# Patient Record
Sex: Female | Born: 1980 | Race: White | Hispanic: No | Marital: Married | Smoking: Never smoker
Health system: Southern US, Community
[De-identification: ages and names within clinical notes are randomized; demographics above are authoritative.]

## PROBLEM LIST (undated history)

## (undated) DIAGNOSIS — O169 Unspecified maternal hypertension, unspecified trimester: Secondary | ICD-10-CM

## (undated) DIAGNOSIS — O10919 Unspecified pre-existing hypertension complicating pregnancy, unspecified trimester: Secondary | ICD-10-CM

## (undated) DIAGNOSIS — O358XX0 Maternal care for other (suspected) fetal abnormality and damage, not applicable or unspecified: Secondary | ICD-10-CM

## (undated) DIAGNOSIS — O24419 Gestational diabetes mellitus in pregnancy, unspecified control: Secondary | ICD-10-CM

## (undated) DIAGNOSIS — O35BXX0 Maternal care for other (suspected) fetal abnormality and damage, fetal cardiac anomalies, not applicable or unspecified: Secondary | ICD-10-CM

## (undated) HISTORY — PX: MOUTH SURGERY: SHX715

---

## 2011-05-21 LAB — OB RESULTS CONSOLE ANTIBODY SCREEN: Antibody Screen: NEGATIVE

## 2011-05-21 LAB — OB RESULTS CONSOLE ABO/RH

## 2011-12-12 ENCOUNTER — Ambulatory Visit (HOSPITAL_COMMUNITY)
Admission: RE | Admit: 2011-12-12 | Discharge: 2011-12-12 | Disposition: A | Discharge status: Home / Self Care | Payer: BC Managed Care – PPO | Source: Ambulatory Visit | Attending: Certified Nurse Midwife | Admitting: Certified Nurse Midwife

## 2011-12-12 ENCOUNTER — Encounter (HOSPITAL_COMMUNITY): Payer: Self-pay | Admitting: Certified Nurse Midwife

## 2011-12-12 ENCOUNTER — Other Ambulatory Visit (HOSPITAL_COMMUNITY): Payer: Self-pay | Admitting: Certified Nurse Midwife

## 2011-12-12 ENCOUNTER — Inpatient Hospital Stay (HOSPITAL_COMMUNITY)
Admission: AD | Admit: 2011-12-12 | Discharge: 2011-12-16 | DRG: 370 | Disposition: A | Discharge status: Home / Self Care | Payer: BC Managed Care – PPO | Source: Ambulatory Visit | Attending: Obstetrics & Gynecology | Admitting: Obstetrics & Gynecology

## 2011-12-12 ENCOUNTER — Encounter (HOSPITAL_COMMUNITY): Payer: Self-pay

## 2011-12-12 DIAGNOSIS — O359XX0 Maternal care for (suspected) fetal abnormality and damage, unspecified, not applicable or unspecified: Secondary | ICD-10-CM | POA: Diagnosis present

## 2011-12-12 DIAGNOSIS — O2690 Pregnancy related conditions, unspecified, unspecified trimester: Secondary | ICD-10-CM

## 2011-12-12 DIAGNOSIS — O1002 Pre-existing essential hypertension complicating childbirth: Secondary | ICD-10-CM | POA: Diagnosis present

## 2011-12-12 DIAGNOSIS — O10019 Pre-existing essential hypertension complicating pregnancy, unspecified trimester: Secondary | ICD-10-CM | POA: Insufficient documentation

## 2011-12-12 DIAGNOSIS — O358XX0 Maternal care for other (suspected) fetal abnormality and damage, not applicable or unspecified: Secondary | ICD-10-CM | POA: Insufficient documentation

## 2011-12-12 DIAGNOSIS — O9903 Anemia complicating the puerperium: Secondary | ICD-10-CM | POA: Diagnosis not present

## 2011-12-12 DIAGNOSIS — D62 Acute posthemorrhagic anemia: Secondary | ICD-10-CM | POA: Diagnosis not present

## 2011-12-12 DIAGNOSIS — O169 Unspecified maternal hypertension, unspecified trimester: Secondary | ICD-10-CM

## 2011-12-12 DIAGNOSIS — O24419 Gestational diabetes mellitus in pregnancy, unspecified control: Secondary | ICD-10-CM

## 2011-12-12 DIAGNOSIS — Z01812 Encounter for preprocedural laboratory examination: Secondary | ICD-10-CM

## 2011-12-12 DIAGNOSIS — O99814 Abnormal glucose complicating childbirth: Secondary | ICD-10-CM | POA: Diagnosis present

## 2011-12-12 DIAGNOSIS — O99810 Abnormal glucose complicating pregnancy: Secondary | ICD-10-CM | POA: Insufficient documentation

## 2011-12-12 HISTORY — DX: Unspecified pre-existing hypertension complicating pregnancy, unspecified trimester: O10.919

## 2011-12-12 HISTORY — DX: Maternal care for other (suspected) fetal abnormality and damage, not applicable or unspecified: O35.8XX0

## 2011-12-12 HISTORY — DX: Gestational diabetes mellitus in pregnancy, unspecified control: O24.419

## 2011-12-12 HISTORY — DX: Unspecified maternal hypertension, unspecified trimester: O16.9

## 2011-12-12 HISTORY — DX: Maternal care for other (suspected) fetal abnormality and damage, fetal cardiac anomalies, not applicable or unspecified: O35.BXX0

## 2011-12-12 LAB — COMPREHENSIVE METABOLIC PANEL
ALT: 11 U/L (ref 0–35)
AST: 18 U/L (ref 0–37)
Albumin: 2.6 g/dL — ABNORMAL LOW (ref 3.5–5.2)
Alkaline Phosphatase: 188 U/L — ABNORMAL HIGH (ref 39–117)
BUN: 12 mg/dL (ref 6–23)
CO2: 20 mEq/L (ref 19–32)
Calcium: 9.7 mg/dL (ref 8.4–10.5)
Chloride: 99 mEq/L (ref 96–112)
Creatinine, Ser: 0.61 mg/dL (ref 0.50–1.10)
GFR calc Af Amer: >90 mL/min (ref >90)
GFR calc non Af Amer: >90 mL/min (ref >90)
Glucose, Bld: 114 mg/dL — ABNORMAL HIGH (ref 70–99)
Potassium: 4 mEq/L (ref 3.5–5.1)
Sodium: 133 mEq/L — ABNORMAL LOW (ref 135–145)
Total Bilirubin: 0.2 mg/dL — ABNORMAL LOW (ref 0.3–1.2)
Total Protein: 6.3 g/dL (ref 6.0–8.3)

## 2011-12-12 LAB — CBC
HCT: 31 % — ABNORMAL LOW (ref 36.0–46.0)
Hemoglobin: 10.1 g/dL — ABNORMAL LOW (ref 12.0–15.0)
MCH: 27.5 pg (ref 26.0–34.0)
MCHC: 32.6 g/dL (ref 30.0–36.0)
MCV: 84.5 fL (ref 78.0–100.0)
Platelets: 272 10*3/uL (ref 150–400)
RBC: 3.67 MIL/uL — ABNORMAL LOW (ref 3.87–5.11)
RDW: 14 % (ref 11.5–15.5)
WBC: 9.1 10*3/uL (ref 4.0–10.5)

## 2011-12-12 MED ORDER — FAMOTIDINE 20 MG PO TABS
40.0000 mg | ORAL_TABLET | Freq: Every day | ORAL | Status: DC
  Filled 2011-12-12: qty 1

## 2011-12-12 MED ORDER — SODIUM CHLORIDE 0.9 % IV SOLN
8.0000 mg | Freq: Once | INTRAVENOUS | Status: AC
  Administered 2011-12-12: 8 mg via INTRAVENOUS
  Filled 2011-12-12: qty 4

## 2011-12-12 MED ORDER — PRENATAL MULTIVITAMIN CH: 1.0000 | ORAL_TABLET | Freq: Every day | ORAL | Status: DC

## 2011-12-12 MED ORDER — LACTATED RINGERS IV SOLN: INTRAVENOUS | Status: DC

## 2011-12-12 MED ORDER — LABETALOL HCL 300 MG PO TABS
300.0000 mg | ORAL_TABLET | Freq: Three times a day (TID) | ORAL | Status: DC
  Administered 2011-12-13: 300 mg via ORAL
  Filled 2011-12-12 (×2): qty 1

## 2011-12-12 MED ORDER — ZOLPIDEM TARTRATE 5 MG PO TABS
5.0000 mg | ORAL_TABLET | Freq: Every evening | ORAL | Status: DC | PRN
  Administered 2011-12-13: 5 mg via ORAL
  Filled 2011-12-12: qty 1

## 2011-12-12 MED ORDER — ACETAMINOPHEN 325 MG PO TABS: 650.0000 mg | ORAL_TABLET | ORAL | Status: DC | PRN

## 2011-12-12 NOTE — H&P (Addendum)
MD note Met with pt and reviewed findings. Also d/w Dr Sherrie George who repeated sonogram today. Agree with above note and plan and recommend cesarean delivery since unfavorable cervix and remote from delivery. 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 Suspected fetal cardiac anomaly on fetal echo at 36 wks after audible arrythmia in office doing ANtesting for [place]. Since then BPP done twice and yesterday sono noted fetal skin edema, s/p MFM consult, see above.  Physical exam:  A&O x 3, no acute distress. Pleasant HEENT neg, no thyromegaly Lungs CTA bilat CV RRR, S1S2 normal Abdo soft, non tender, non acute Extr no edema/ tenderness Pelvic deferred FHT 140s/ + accels/ no decels/ moderate variab- reactive.  Toco none  Risks/complications of surgery reviewed incl infection, bleeding, damage to internal organs including bladder, bowels, ureters, blood vessels, other risks from anesthesia, VTE and delayed complications of any surgery, complications in future surgery reviewed. Also discussed neonatal complications incl difficult delivery, laceration, vacuum assistance, TTN etc. Pt understands and agrees, all concerns addressed.

## 2011-12-12 NOTE — H&P (Signed)
OB ADMISSION/ HISTORY & PHYSICAL:  Admission Date: 12/12/2011  8:19 PM  Admit Diagnosis: Pregnancy at 37 weeks affected by fetal cardiac anomaly / Chronic hypertension / Glucose intolerance - GDMA1 / obesity  Kayla Gibbs is a 31 y.o. female presenting for admission / continuous EFM until scheduled CS in am. Newly detected fetal cardiac anomaly at 36 week due to audible FHR arrythmia during NST. Per fetal echo notes from Dr Mayer Camel [facility]): possible coarctation of aorta VS narrowing of ductus VS premature closure of ductus. No medication to cause premature closure of ductus. Recommend for twice weekly fetal testing and to repeat fetal echo in 2 weeks - scheduled for 38 weeks with Dr Mayer Camel.   Questionable fetal testing Tuesday this week  (6/10) -with questionable decreased tone with active FM and reactive NST. Repeat AT today (8/10)  with non-reactive NST but BPP 8-8 and normal UA doppler. Ultrasonographer noted change in fetal appearance with fetal edema over abdomen and head. Consult with Dr Juliene Pina - patient sent for second opinion with MFM this afternoon - confirmed fetal edema.   Discussion with patient and spouse per myself and Dr Juliene Pina regarding ultrasound finding with hx of possible fetal cardiac anomaly now showing signs of changes in cardiac function with fetal edema.Per recommendation of Dr Mayer Camel - deliver with any signs of change in fetal status suggestive of change in cardiac function.  Recommend delivery with decision for CS due to fetal status and unfavorable cervix in primigravida. Will schedule Friday in day hours to ensure adequate neonatal / pediatric support for newborn - recommend continuous fetal surveillance until delivery. Patient and spouse understands plan of care. Neonatal team updated with impending delivery by Dr Juliene Pina.  Prenatal History: G1P0000   EDC : 12/28/2011, by Last Menstrual Period  Prenatal care at [facility] since [redacted] weeks  gestation  Prenatal course complicated by obesity / chronic hypertension / HSV outbreak in pregnancy / GERD /  glucose intolerance GDMA1 / fetal arrythmia / possible fetal cardiac anomaly.  Prenatal Labs: ABO, Rh:   O positive Antibody:  negative Rubella:   Immune RPR:   NR HBsAg:   Negative HIV:   NR GBS:   negative 1 hr Glucola : early screen abnormal / diet and CBG monitoring management                       HgbA1c with early screen - 5.1 / third trimester repeat 5.6   Medical / Surgical History :  Past medical history:  Past Medical History  Diagnosis Date  . Gestational diabetes     GERD   Chronic hypertension  Past surgical history: No past surgical history on file. Oral surgery - wisdom teeth extraction  Family History: No family history on file.   Social History:  does not have a smoking history on file. She does not have any smokeless tobacco history on file. Her alcohol and drug histories not on file. Married No substance use  Allergies: Review of patient's allergies indicates no known allergies.  NKDA No latex allergy  Current Medications at time of admission:  PNV daily Labetalol 300 mg TID Zantac 300 mg at HS and 150 mg in am Valtrex 500mg  with HSV outbreak  Review of Systems: Active FM Denies ctx / LOF / bleeding  Physical Exam: Alert and oriented x 3 Heart - Lungs -  Abdomen - gravid / soft / non-tender Extremities - trace dependent edema  Assessment: 37 5/[redacted] weeks pregnant Fetal edema with fetal cardiac anamoly Chronic hypertension - stable on labetalol 300 TID Glucose intolerance - GDMA1 GERD   Plan:  Admit Continuous EFM until scheduled CS Planned primary cesarean section in AM NPO after midnight Pre-op order in am  Marlinda Mike CNM, MSN 12/12/2011, 8:29 PM

## 2011-12-13 ENCOUNTER — Encounter (HOSPITAL_COMMUNITY): Payer: Self-pay

## 2011-12-13 ENCOUNTER — Encounter (HOSPITAL_COMMUNITY)
Admission: AD | Disposition: A | Discharge status: Home / Self Care | Payer: Self-pay | Source: Ambulatory Visit | Attending: Obstetrics & Gynecology

## 2011-12-13 ENCOUNTER — Encounter (HOSPITAL_COMMUNITY): Payer: Self-pay | Admitting: Anesthesiology

## 2011-12-13 ENCOUNTER — Encounter (HOSPITAL_COMMUNITY): Payer: Self-pay | Admitting: General Surgery

## 2011-12-13 ENCOUNTER — Inpatient Hospital Stay (HOSPITAL_COMMUNITY): Payer: BC Managed Care – PPO

## 2011-12-13 DIAGNOSIS — O169 Unspecified maternal hypertension, unspecified trimester: Secondary | ICD-10-CM

## 2011-12-13 DIAGNOSIS — O24419 Gestational diabetes mellitus in pregnancy, unspecified control: Secondary | ICD-10-CM | POA: Diagnosis present

## 2011-12-13 HISTORY — DX: Unspecified maternal hypertension, unspecified trimester: O16.9

## 2011-12-13 HISTORY — DX: Gestational diabetes mellitus in pregnancy, unspecified control: O24.419

## 2011-12-13 LAB — RPR: RPR Ser Ql: NONREACTIVE

## 2011-12-13 LAB — GLUCOSE, CAPILLARY: Glucose-Capillary: 113 mg/dL — ABNORMAL HIGH (ref 70–99)

## 2011-12-13 SURGERY — Surgical Case
Anesthesia: Spinal | Site: Abdomen | Wound class: Clean Contaminated

## 2011-12-13 MED ORDER — PHENYLEPHRINE HCL 10 MG/ML IJ SOLN
INTRAMUSCULAR | Status: DC | PRN
  Administered 2011-12-13: 80 ug via INTRAVENOUS
  Administered 2011-12-13 (×3): 40 ug via INTRAVENOUS

## 2011-12-13 MED ORDER — ONDANSETRON HCL 4 MG/2ML IJ SOLN: 4.0000 mg | Freq: Three times a day (TID) | INTRAMUSCULAR | Status: DC | PRN

## 2011-12-13 MED ORDER — LANOLIN HYDROUS EX OINT: 1.0000 "application " | TOPICAL_OINTMENT | CUTANEOUS | Status: DC | PRN

## 2011-12-13 MED ORDER — CITRIC ACID-SODIUM CITRATE 334-500 MG/5ML PO SOLN
30.0000 mL | Freq: Once | ORAL | Status: AC
  Administered 2011-12-13: 30 mL via ORAL
  Filled 2011-12-13 (×2): qty 15

## 2011-12-13 MED ORDER — FAMOTIDINE 20 MG PO TABS
20.0000 mg | ORAL_TABLET | Freq: Every day | ORAL | Status: DC
  Administered 2011-12-14 – 2011-12-16 (×3): 20 mg via ORAL
  Filled 2011-12-13 (×3): qty 1

## 2011-12-13 MED ORDER — PHENYLEPHRINE 40 MCG/ML (10ML) SYRINGE FOR IV PUSH (FOR BLOOD PRESSURE SUPPORT)
PREFILLED_SYRINGE | INTRAVENOUS | Status: AC
  Filled 2011-12-13: qty 5

## 2011-12-13 MED ORDER — IBUPROFEN 600 MG PO TABS
600.0000 mg | ORAL_TABLET | Freq: Four times a day (QID) | ORAL | Status: DC
  Filled 2011-12-13: qty 1

## 2011-12-13 MED ORDER — FENTANYL CITRATE 0.05 MG/ML IJ SOLN
INTRAMUSCULAR | Status: AC
  Filled 2011-12-13: qty 2

## 2011-12-13 MED ORDER — PRENATAL MULTIVITAMIN CH
1.0000 | ORAL_TABLET | Freq: Every day | ORAL | Status: DC
  Administered 2011-12-13: 1 via ORAL
  Filled 2011-12-13 (×2): qty 1

## 2011-12-13 MED ORDER — NALBUPHINE HCL 10 MG/ML IJ SOLN
5.0000 mg | INTRAMUSCULAR | Status: DC | PRN
  Filled 2011-12-13: qty 1

## 2011-12-13 MED ORDER — KETOROLAC TROMETHAMINE 30 MG/ML IJ SOLN
INTRAMUSCULAR | Status: AC
  Administered 2011-12-13: 30 mg via INTRAVENOUS
  Filled 2011-12-13: qty 1

## 2011-12-13 MED ORDER — SODIUM CHLORIDE 0.9 % IJ SOLN: 3.0000 mL | INTRAMUSCULAR | Status: DC | PRN

## 2011-12-13 MED ORDER — METOCLOPRAMIDE HCL 5 MG/ML IJ SOLN: 10.0000 mg | Freq: Three times a day (TID) | INTRAMUSCULAR | Status: DC | PRN

## 2011-12-13 MED ORDER — SODIUM CHLORIDE 0.9 % IV SOLN
1.0000 ug/kg/h | INTRAVENOUS | Status: DC | PRN
  Filled 2011-12-13: qty 2.5

## 2011-12-13 MED ORDER — FENTANYL CITRATE 0.05 MG/ML IJ SOLN
INTRAMUSCULAR | Status: DC | PRN
  Administered 2011-12-13: 25 ug via INTRATHECAL

## 2011-12-13 MED ORDER — SCOPOLAMINE 1 MG/3DAYS TD PT72
1.0000 | MEDICATED_PATCH | TRANSDERMAL | Status: DC
  Administered 2011-12-13: 1.5 mg via TRANSDERMAL
  Filled 2011-12-13: qty 1

## 2011-12-13 MED ORDER — MORPHINE SULFATE (PF) 0.5 MG/ML IJ SOLN
INTRAMUSCULAR | Status: DC | PRN
  Administered 2011-12-13: .5 mg via INTRATHECAL

## 2011-12-13 MED ORDER — DIBUCAINE 1 % RE OINT: 1.0000 "application " | TOPICAL_OINTMENT | RECTAL | Status: DC | PRN

## 2011-12-13 MED ORDER — CEFAZOLIN SODIUM 10 G IJ SOLR
3.0000 g | INTRAMUSCULAR | Status: DC
  Filled 2011-12-13: qty 3000

## 2011-12-13 MED ORDER — DIPHENHYDRAMINE HCL 25 MG PO CAPS: 25.0000 mg | ORAL_CAPSULE | Freq: Four times a day (QID) | ORAL | Status: DC | PRN

## 2011-12-13 MED ORDER — SENNOSIDES-DOCUSATE SODIUM 8.6-50 MG PO TABS
2.0000 | ORAL_TABLET | Freq: Every day | ORAL | Status: DC
  Administered 2011-12-13 – 2011-12-15 (×3): 2 via ORAL

## 2011-12-13 MED ORDER — DEXTROSE 5 % IV SOLN
3.0000 g | INTRAVENOUS | Status: DC | PRN
  Administered 2011-12-13: 3 g via INTRAVENOUS

## 2011-12-13 MED ORDER — KETOROLAC TROMETHAMINE 30 MG/ML IJ SOLN
30.0000 mg | Freq: Four times a day (QID) | INTRAMUSCULAR | Status: DC | PRN
  Administered 2011-12-13: 30 mg via INTRAVENOUS

## 2011-12-13 MED ORDER — LACTATED RINGERS IV SOLN
INTRAVENOUS | Status: DC | PRN
  Administered 2011-12-13 (×2): via INTRAVENOUS

## 2011-12-13 MED ORDER — TETANUS-DIPHTH-ACELL PERTUSSIS 5-2.5-18.5 LF-MCG/0.5 IM SUSP: 0.5000 mL | Freq: Once | INTRAMUSCULAR | Status: DC

## 2011-12-13 MED ORDER — KETOROLAC TROMETHAMINE 30 MG/ML IJ SOLN: 30.0000 mg | Freq: Four times a day (QID) | INTRAMUSCULAR | Status: DC | PRN

## 2011-12-13 MED ORDER — ONDANSETRON HCL 4 MG/2ML IJ SOLN
INTRAMUSCULAR | Status: AC
  Filled 2011-12-13: qty 2

## 2011-12-13 MED ORDER — SIMETHICONE 80 MG PO CHEW: 80.0000 mg | CHEWABLE_TABLET | ORAL | Status: DC | PRN

## 2011-12-13 MED ORDER — DIPHENHYDRAMINE HCL 50 MG/ML IJ SOLN: 12.5000 mg | INTRAMUSCULAR | Status: DC | PRN

## 2011-12-13 MED ORDER — DIPHENHYDRAMINE HCL 25 MG PO CAPS: 25.0000 mg | ORAL_CAPSULE | ORAL | Status: DC | PRN

## 2011-12-13 MED ORDER — WITCH HAZEL-GLYCERIN EX PADS: 1.0000 "application " | MEDICATED_PAD | CUTANEOUS | Status: DC | PRN

## 2011-12-13 MED ORDER — IBUPROFEN 100 MG/5ML PO SUSP
600.0000 mg | Freq: Four times a day (QID) | ORAL | Status: DC
  Administered 2011-12-13 – 2011-12-15 (×7): 600 mg via ORAL
  Filled 2011-12-13 (×8): qty 30

## 2011-12-13 MED ORDER — SIMETHICONE 80 MG PO CHEW
80.0000 mg | CHEWABLE_TABLET | Freq: Three times a day (TID) | ORAL | Status: DC
  Administered 2011-12-13 – 2011-12-16 (×12): 80 mg via ORAL

## 2011-12-13 MED ORDER — DIPHENHYDRAMINE HCL 50 MG/ML IJ SOLN: 25.0000 mg | INTRAMUSCULAR | Status: DC | PRN

## 2011-12-13 MED ORDER — ONDANSETRON HCL 4 MG/2ML IJ SOLN
INTRAMUSCULAR | Status: DC | PRN
  Administered 2011-12-13: 4 mg via INTRAVENOUS

## 2011-12-13 MED ORDER — SCOPOLAMINE 1 MG/3DAYS TD PT72
1.0000 | MEDICATED_PATCH | Freq: Once | TRANSDERMAL | Status: DC
  Filled 2011-12-13: qty 1

## 2011-12-13 MED ORDER — LACTATED RINGERS IV SOLN
INTRAVENOUS | Status: DC
  Administered 2011-12-13: 23:00:00 via INTRAVENOUS

## 2011-12-13 MED ORDER — OXYTOCIN 10 UNIT/ML IJ SOLN
40.0000 [IU] | INTRAVENOUS | Status: DC | PRN
  Administered 2011-12-13: 40 [IU] via INTRAVENOUS

## 2011-12-13 MED ORDER — LABETALOL HCL 300 MG PO TABS
300.0000 mg | ORAL_TABLET | Freq: Three times a day (TID) | ORAL | Status: DC
  Administered 2011-12-13 – 2011-12-16 (×9): 300 mg via ORAL
  Filled 2011-12-13 (×9): qty 1

## 2011-12-13 MED ORDER — MEPERIDINE HCL 25 MG/ML IJ SOLN
INTRAMUSCULAR | Status: AC
  Administered 2011-12-13: 6.25 mg via INTRAVENOUS
  Filled 2011-12-13: qty 1

## 2011-12-13 MED ORDER — IBUPROFEN 600 MG PO TABS: 600.0000 mg | ORAL_TABLET | Freq: Four times a day (QID) | ORAL | Status: DC | PRN

## 2011-12-13 MED ORDER — OXYCODONE-ACETAMINOPHEN 5-325 MG PO TABS: 1.0000 | ORAL_TABLET | ORAL | Status: DC | PRN

## 2011-12-13 MED ORDER — ONDANSETRON HCL 4 MG PO TABS: 4.0000 mg | ORAL_TABLET | ORAL | Status: DC | PRN

## 2011-12-13 MED ORDER — MEPERIDINE HCL 25 MG/ML IJ SOLN
6.2500 mg | INTRAMUSCULAR | Status: DC | PRN
  Administered 2011-12-13: 6.25 mg via INTRAVENOUS

## 2011-12-13 MED ORDER — BUPIVACAINE IN DEXTROSE 0.75-8.25 % IT SOLN
INTRATHECAL | Status: DC | PRN
  Administered 2011-12-13: 1.6 mL via INTRATHECAL

## 2011-12-13 MED ORDER — ZOLPIDEM TARTRATE 5 MG PO TABS: 5.0000 mg | ORAL_TABLET | Freq: Every evening | ORAL | Status: DC | PRN

## 2011-12-13 MED ORDER — MORPHINE SULFATE 0.5 MG/ML IJ SOLN
INTRAMUSCULAR | Status: AC
  Filled 2011-12-13: qty 10

## 2011-12-13 MED ORDER — ONDANSETRON HCL 4 MG/2ML IJ SOLN: 4.0000 mg | INTRAMUSCULAR | Status: DC | PRN

## 2011-12-13 MED ORDER — NALOXONE HCL 0.4 MG/ML IJ SOLN: 0.4000 mg | INTRAMUSCULAR | Status: DC | PRN

## 2011-12-13 MED ORDER — MENTHOL 3 MG MT LOZG: 1.0000 | LOZENGE | OROMUCOSAL | Status: DC | PRN

## 2011-12-13 MED ORDER — OXYTOCIN 40 UNITS IN LACTATED RINGERS INFUSION - SIMPLE MED: 62.5000 mL/h | INTRAVENOUS | Status: AC

## 2011-12-13 MED ORDER — FENTANYL CITRATE 0.05 MG/ML IJ SOLN: 25.0000 ug | INTRAMUSCULAR | Status: DC | PRN

## 2011-12-13 MED ORDER — OXYTOCIN 10 UNIT/ML IJ SOLN
INTRAMUSCULAR | Status: AC
  Filled 2011-12-13: qty 4

## 2011-12-13 SURGICAL SUPPLY — 46 items
BENZOIN TINCTURE PRP APPL 2/3 (GAUZE/BANDAGES/DRESSINGS) ×2 IMPLANT
CHLORAPREP W/TINT 26ML (MISCELLANEOUS) ×2 IMPLANT
CLOTH BEACON ORANGE TIMEOUT ST (SAFETY) ×2 IMPLANT
CONTAINER PREFILL 10% NBF 15ML (MISCELLANEOUS) IMPLANT
DRESSING TELFA 8X3 (GAUZE/BANDAGES/DRESSINGS) IMPLANT
DRSG COVADERM 4X10 (GAUZE/BANDAGES/DRESSINGS) ×2 IMPLANT
ELECT REM PT RETURN 9FT ADLT (ELECTROSURGICAL) ×2
ELECTRODE REM PT RTRN 9FT ADLT (ELECTROSURGICAL) ×1 IMPLANT
EXTRACTOR VACUUM KIWI (MISCELLANEOUS) IMPLANT
EXTRACTOR VACUUM M CUP 4 TUBE (SUCTIONS) ×2 IMPLANT
GAUZE SPONGE 4X4 12PLY STRL LF (GAUZE/BANDAGES/DRESSINGS) IMPLANT
GLOVE BIO SURGEON STRL SZ7 (GLOVE) ×2 IMPLANT
GLOVE BIOGEL PI IND STRL 7.0 (GLOVE) ×6 IMPLANT
GLOVE BIOGEL PI INDICATOR 7.0 (GLOVE) ×6
GLOVE ECLIPSE 6.5 STRL STRAW (GLOVE) ×6 IMPLANT
GLOVE SURG SS PI 7.0 STRL IVOR (GLOVE) ×4 IMPLANT
GOWN PREVENTION PLUS LG XLONG (DISPOSABLE) ×2 IMPLANT
GOWN STRL REIN XL XLG (GOWN DISPOSABLE) ×8 IMPLANT
KIT ABG SYR 3ML LUER SLIP (SYRINGE) ×2 IMPLANT
NEEDLE HYPO 25X5/8 SAFETYGLIDE (NEEDLE) ×2 IMPLANT
NS IRRIG 1000ML POUR BTL (IV SOLUTION) ×4 IMPLANT
PACK C SECTION WH (CUSTOM PROCEDURE TRAY) ×2 IMPLANT
PAD ABD 7.5X8 STRL (GAUZE/BANDAGES/DRESSINGS) IMPLANT
PAD OB MATERNITY 4.3X12.25 (PERSONAL CARE ITEMS) IMPLANT
RTRCTR C-SECT PINK 25CM LRG (MISCELLANEOUS) IMPLANT
SLEEVE SCD COMPRESS KNEE MED (MISCELLANEOUS) ×2 IMPLANT
SPONGE GAUZE 4X4 12PLY (GAUZE/BANDAGES/DRESSINGS) ×2 IMPLANT
STAPLER VISISTAT 35W (STAPLE) IMPLANT
STRIP CLOSURE SKIN 1/2X4 (GAUZE/BANDAGES/DRESSINGS) ×2 IMPLANT
STRIP CLOSURE SKIN 1/4X4 (GAUZE/BANDAGES/DRESSINGS) IMPLANT
SUT MNCRL 0 VIOLET CTX 36 (SUTURE) ×3 IMPLANT
SUT MONOCRYL 0 CTX 36 (SUTURE) ×3
SUT PLAIN 0 NONE (SUTURE) IMPLANT
SUT PLAIN 2 0 (SUTURE)
SUT PLAIN 2 0 XLH (SUTURE) ×2 IMPLANT
SUT PLAIN ABS 2-0 CT1 27XMFL (SUTURE) IMPLANT
SUT VIC AB 0 CT1 27 (SUTURE) ×3
SUT VIC AB 0 CT1 27XBRD ANBCTR (SUTURE) ×3 IMPLANT
SUT VIC AB 2-0 CT1 27 (SUTURE) ×2
SUT VIC AB 2-0 CT1 TAPERPNT 27 (SUTURE) ×2 IMPLANT
SUT VIC AB 4-0 KS 27 (SUTURE) ×2 IMPLANT
SUT VICRYL 0 TIES 12 18 (SUTURE) IMPLANT
TAPE CLOTH SURG 4X10 WHT LF (GAUZE/BANDAGES/DRESSINGS) ×2 IMPLANT
TOWEL OR 17X24 6PK STRL BLUE (TOWEL DISPOSABLE) ×4 IMPLANT
TRAY FOLEY CATH 14FR (SET/KITS/TRAYS/PACK) ×2 IMPLANT
WATER STERILE IRR 1000ML POUR (IV SOLUTION) ×2 IMPLANT

## 2011-12-13 NOTE — Anesthesia Postprocedure Evaluation (Signed)
  Anesthesia Post-op Note  Patient: Kayla Gibbs  Procedure(s) Performed: Procedure(s) (LRB) with comments: CESAREAN SECTION (N/A)  Patient Location: PACU  Anesthesia Type: Spinal  Level of Consciousness: awake, alert  and oriented  Airway and Oxygen Therapy: Patient Spontanous Breathing  Post-op Pain: none  Post-op Assessment: Post-op Vital signs reviewed, Patient's Cardiovascular Status Stable, Respiratory Function Stable, Patent Airway, No signs of Nausea or vomiting, Pain level controlled, No headache and No backache  Post-op Vital Signs: Reviewed and stable  Complications: No apparent anesthesia complications

## 2011-12-13 NOTE — Anesthesia Procedure Notes (Signed)
Spinal  Patient location during procedure: OR Start time: 12/13/2011 10:26 AM Staffing Anesthesiologist: Norabelle Kondo A. Performed by: anesthesiologist  Preanesthetic Checklist Completed: patient identified, site marked, surgical consent, pre-op evaluation, timeout performed, IV checked, risks and benefits discussed and monitors and equipment checked Spinal Block Patient position: sitting Prep: site prepped and draped and DuraPrep Patient monitoring: heart rate, cardiac monitor, continuous pulse ox and blood pressure Approach: midline Location: L3-4 Injection technique: single-shot Needle Needle type: Sprotte  Needle gauge: 24 G Needle length: 9 cm Needle insertion depth: 6 cm Assessment Sensory level: T4 Additional Notes Patient identified. Risks and benefits discussed including failed block, incomplete  Pain control, post dural puncture headache, nerve damage, paralysis, blood pressure Changes, nausea, vomiting, reactions to medications-both toxic and allergic and post Partum back pain. All questions were answered. Patient expressed understanding and wished to proceed. Sterile technique was used throughout procedure. Epidural site was Dressed with sterile barrier dressing. No paresthesias, signs of intravascular injection Or signs of intrathecal spread were encountered.  Patient was more comfortable after the epidural was dosed. Please see RN's note for documentation of vital signs and FHR which are stable.

## 2011-12-13 NOTE — Anesthesia Preprocedure Evaluation (Signed)
Anesthesia Evaluation    Airway Mallampati: III TM Distance: >3 FB Neck ROM: Full    Dental No notable dental hx. (+) Teeth Intact   Pulmonary neg pulmonary ROS,  breath sounds clear to auscultation  Pulmonary exam normal       Cardiovascular hypertension, Pt. on medications and Pt. on home beta blockers Rhythm:Regular Rate:Normal     Neuro/Psych negative neurological ROS  negative psych ROS   GI/Hepatic Neg liver ROS, GERD-  Medicated and Controlled,Nausea throughout pregnancy   Endo/Other  diabetes, Well ControlledMorbid obesity  Renal/GU negative Renal ROS  negative genitourinary   Musculoskeletal negative musculoskeletal ROS (+)   Abdominal (+) + obese,   Peds  Hematology negative hematology ROS (+)   Anesthesia Other Findings   Reproductive/Obstetrics (+) Pregnancy 38wks AOG suspected Fetal Cardiac Anomalies                           Anesthesia Physical Anesthesia Plan  ASA: III  Anesthesia Plan: Spinal   Post-op Pain Management:    Induction: Intravenous  Airway Management Planned: Natural Airway  Additional Equipment:   Intra-op Plan:   Post-operative Plan:   Informed Consent: I have reviewed the patients History and Physical, chart, labs and discussed the procedure including the risks, benefits and alternatives for the proposed anesthesia with the patient or authorized representative who has indicated his/her understanding and acceptance.   Dental advisory given  Plan Discussed with: Anesthesiologist, CRNA and Surgeon  Anesthesia Plan Comments:         Anesthesia Quick Evaluation

## 2011-12-13 NOTE — Anesthesia Postprocedure Evaluation (Signed)
Anesthesia Post Note  Patient: Kayla Gibbs  Procedure(s) Performed: Procedure(s) (LRB): CESAREAN SECTION (N/A)  Anesthesia type: Spinal  Patient location: Mother/Baby  Post pain: Pain level controlled  Post assessment: Post-op Vital signs reviewed  Last Vitals:  Filed Vitals:   12/13/11 1627  BP: 142/89  Pulse: 71  Temp: 36.8 C  Resp: 20    Post vital signs: Reviewed  Level of consciousness: awake  Complications: No apparent anesthesia complications

## 2011-12-13 NOTE — Op Note (Signed)
Primary Low Transverse Cesarean Section Procedure Note  12/13/2011  Kayla Gibbs  Indications: 31 yo, Chronic HTN and Gestational DM, noted audible arrhythemia during antenatal testing. Fetal echo by Peds Cardiologist at 37 wks suspected fetal cardiac anomaly. Sono at 37.5 wk for Biophysical profile noted fetal skin edema, concern for early signs of heart failure, maternal cervix not favorable for urgent vaginal delivery. MFM sono on 9/19 consistent with findings and recommended delivery.   Pre-operative Diagnosis:  37.6 wks, suspected fetal cardiac anomaly with fetal skin edema,   Post-operative Diagnosis:  Same  Surgeon: Robley Fries, MD   Assistants: Marlinda Mike, CNM  Anesthesia: Spinal  Procedure Details:  The patient was seen in labor room where she was admitted overnight for fetal monitoring. , The risks, benefits, complications, treatment options, and expected outcomes were discussed with the patient. The patient concurred with the proposed plan, giving informed consent. identified as Kayla Gibbs and the procedure verified as C-Section Delivery. She was brought to the OR and received 3 gm Ancef pre-op. A Time Out was held and the above information confirmed.  After induction of anesthesia, the patient was draped and prepped in the usual sterile manner. A Pfannenstiel Incision was made and carried down through the subcutaneous tissue to the fascia. Fascial incision was made and extended transversely. The fascia was separated from the underlying rectus tissue superiorly and inferiorly. The peritoneum was identified and entered. Peritoneal incision was extended longitudinally. The utero-vesical peritoneal reflection was incised transversely and the bladder flap was bluntly freed from the lower uterine segment. A low transverse uterine incision was made. Amniotomy noted clear amniotic fluid. Delivered from cephalic presentation was a Female infant at 10.45 am, loose cord around the  neck reduced and baby delivered. Cord clamped and cut. Mouth, nose bulb suctioned and baby handed to NICU team. Apgars 6 and 9 at 1 and 5 min. Cord ph and cord blood were obtained for evaluation. The placenta was removed Intact and appeared normal. The uterine outline, tubes and ovaries appeared normal. The uterine incision was closed in 2 layers, running locked sutures of 0Vicryl followed by imbricating layer. Hemostasis was observed.  Peritoneal gutters cleaned. Peritoneum closed with 2-0 Vicryl. Muscles approximated in midline. The fascia was then reapproximated with running sutures of 0Vicryl. The subcuticular closure was performed using 2-0plain gut. The skin was closed with 4-0Vicryl. Steristrips applied and pressure dressing placed.   Instrument, sponge, and needle counts were correct prior the abdominal closure and were correct at the conclusion of the case.    Findings:Baby girl, unengaged, delivered cephalic at 10.45 am. Apgars 6, 9 at 1 and 5 min. NICU team received baby and brought her to NICU. Weight pending. Normal uterus, fallopian tubes and ovaries. Placenta normal, clear amniotic fluid. 3 vessel cord.    Estimated Blood Loss: 700 cc   Total IV Fluids: 500 ml   Urine Output: 200 cc  Specimens: Cord gas, cord blood  Complications: None  Disposition: PACU - hemodynamically stable.   Maternal Condition: stable   Baby condition / location:  NICU, stable  Attending Attestation: I performed the procedure.   Signed: Surgeon(s): Robley Fries, MD

## 2011-12-13 NOTE — Addendum Note (Signed)
Addendum  created 12/13/11 1810 by Algis Greenhouse, CRNA   Modules edited:Notes Section

## 2011-12-13 NOTE — Transfer of Care (Signed)
Immediate Anesthesia Transfer of Care Note  Patient: Kayla Gibbs  Procedure(s) Performed: Procedure(s) (LRB) with comments: CESAREAN SECTION (N/A)  Patient Location: PACU  Anesthesia Type: Spinal  Level of Consciousness: awake, alert  and oriented  Airway & Oxygen Therapy: Patient Spontanous Breathing  Post-op Assessment: Report given to PACU RN and Post -op Vital signs reviewed and stable  Post vital signs: Reviewed and stable  Complications: No apparent anesthesia complications

## 2011-12-13 NOTE — Progress Notes (Signed)
UR Chart review completed.  

## 2011-12-13 NOTE — Progress Notes (Signed)
Patient ID: Kayla Gibbs, female   DOB: [birth year], 31 y.o.   MRN: [id number] Pt seen this morning and evaluated again.  Nausea/vomiting at admission from anxiety, better with Zofran and slept well with Ambien after FHT was noted to be reactive. Feels better this morning.   31 yo, G1 with CHTN (well controlled on Labetalol), GDM(A1), HSV -cold sore (s/p Valtrex).  Suspected fetal cardiac anomaly on fetal echo at 36 wks after audible arrythmia in office doing ANtesting for [facility]. Since then BPP done twice and yesterday sono noted fetal skin edema, s/p MFM consult, see above.  Saw Dr Mayer Camel [facility])- suspected premature ductal narrowing vs coarctation of aorta.   Physical exam:  A&O x 3, no acute distress. Pleasant  HEENT neg, no thyromegaly  Lungs CTA bilat  CV RRR, S1S2 normal  Abdo soft, non tender, non acute  Extr no edema/ tenderness  Pelvic deferred  FHT 140s/ + accels/ no decels/ moderate variab- reactive.  Toco none   Risks/complications of surgery reviewed incl infection, bleeding, damage to internal organs including bladder, bowels, ureters, blood vessels, other risks from anesthesia, VTE and delayed complications of any surgery, complications in future surgery reviewed. Also discussed neonatal complications incl difficult delivery, laceration, vacuum assistance, TTN etc. Pt understands and agrees, all concerns addressed.   Spoke with Dr Joana Reamer (NICU), findings discussed including c/s delivery at 9.30-10 am. To notify Peds- Cardio after birth.

## 2011-12-14 LAB — CBC
HCT: 26.4 % — ABNORMAL LOW (ref 36.0–46.0)
MCH: 28 pg (ref 26.0–34.0)
MCV: 86 fL (ref 78.0–100.0)
Platelets: 224 10*3/uL (ref 150–400)
RBC: 3.07 MIL/uL — ABNORMAL LOW (ref 3.87–5.11)
RDW: 14.4 % (ref 11.5–15.5)

## 2011-12-14 MED ORDER — OXYCODONE-ACETAMINOPHEN 5-325 MG PO TABS
1.0000 | ORAL_TABLET | ORAL | Status: DC | PRN
  Administered 2011-12-14 – 2011-12-15 (×3): 1 via ORAL
  Filled 2011-12-14 (×3): qty 1

## 2011-12-14 MED ORDER — COMPLETENATE 29-1 MG PO CHEW
1.0000 | CHEWABLE_TABLET | Freq: Every day | ORAL | Status: DC
  Administered 2011-12-14 – 2011-12-16 (×3): 1 via ORAL
  Filled 2011-12-14 (×3): qty 1

## 2011-12-14 MED ORDER — HYDROCHLOROTHIAZIDE 12.5 MG PO CAPS
12.5000 mg | ORAL_CAPSULE | Freq: Every day | ORAL | Status: DC
  Administered 2011-12-14 – 2011-12-16 (×3): 12.5 mg via ORAL
  Filled 2011-12-14 (×3): qty 1

## 2011-12-14 NOTE — Progress Notes (Signed)
POSTOPERATIVE DAY # 1 S/P cesarean section   S:         Reports feeling well             Tolerating po intake / no nausea / no vomiting /  + flatus / no BM             Bleeding is light             Pain controlled with motrin and occasional percocet             Up ad lib / ambulatory  Newborn in NICU - stable  / pumping for breast-feeding    O:  A & O x 3              VS: Blood pressure 136/91, pulse 81, temperature 98.7 F (37.1 C), temperature source Oral, resp. rate 18, height 5\' 5"  (1.651 m), weight 117.935 kg (260 lb), last menstrual period 03/23/2011, SpO2 99.00%, unknown if currently breastfeeding.  LABS: WBC/Hgb/Hct/Plts:  8.0/8.6/26.4/224 (09/21 0530)   I&O:  + 97  Lungs: Clear and unlabored  Heart: regular rate and rhythm / no mumurs  Abdomen: soft, non-tender, non-distended, active BS             Fundus: firm, non-tender, U-1             Dressing intact without drainage              Perineum: no edema  Lochia: light   Extremities: no edema, no calf pain or tenderness, SCD in place  A:        POD # 1 S/P cesarean section            Chronic hypertension            Glucose intolerance  P:        Routine postoperative care              Check FBS              Adjust hypertensive medication to maintain BP control - add HCTZ    Marlinda Mike CNM, MSN 12/14/2011, 11:55 AM

## 2011-12-15 ENCOUNTER — Encounter (HOSPITAL_COMMUNITY): Payer: Self-pay | Admitting: Obstetrics & Gynecology

## 2011-12-15 LAB — GLUCOSE, CAPILLARY: Glucose-Capillary: 119 mg/dL — ABNORMAL HIGH (ref 70–99)

## 2011-12-15 MED ORDER — METFORMIN HCL ER 500 MG PO TB24: 500.0000 mg | ORAL_TABLET | Freq: Two times a day (BID) | ORAL | Status: DC

## 2011-12-15 MED ORDER — IBUPROFEN 100 MG/5ML PO SUSP
800.0000 mg | Freq: Three times a day (TID) | ORAL | Status: DC
  Administered 2011-12-15 – 2011-12-16 (×3): 800 mg via ORAL
  Filled 2011-12-15 (×4): qty 40

## 2011-12-15 MED ORDER — DOCUSATE SODIUM 100 MG PO CAPS
100.0000 mg | ORAL_CAPSULE | Freq: Every day | ORAL | Status: DC
  Administered 2011-12-15 – 2011-12-16 (×2): 100 mg via ORAL
  Filled 2011-12-15 (×2): qty 1

## 2011-12-15 MED ORDER — POLYSACCHARIDE IRON COMPLEX 150 MG PO CAPS
150.0000 mg | ORAL_CAPSULE | Freq: Every day | ORAL | Status: DC
  Administered 2011-12-15 – 2011-12-16 (×2): 150 mg via ORAL
  Filled 2011-12-15 (×2): qty 1

## 2011-12-15 MED ORDER — METFORMIN HCL ER 500 MG PO TB24
500.0000 mg | ORAL_TABLET | Freq: Every day | ORAL | Status: DC
  Filled 2011-12-15: qty 1

## 2011-12-15 NOTE — Progress Notes (Signed)
POSTOPERATIVE DAY # 2 S/P cesarean section   S:         Reports feeling well             Tolerating po intake / no  nausea / no  vomiting / + flatus / no  BM             Bleeding is light             Pain controlled with motrin and percocet - minimal relief with motrin             Up ad lib / ambulatory  Newborn in NICU - pumping for breast feeding     O:  A & O x 3              VS: Blood pressure 147/92, pulse 66, temperature 98.5 F (36.9 C), temperature source Oral, resp. rate 18, height 5\' 5"  (1.651 m), weight 117.935 kg (260 lb), last menstrual period 03/23/2011, SpO2 97.00%, unknown if currently breastfeeding.  LABS:   FBS 113 / 119 today  I&O:   -552  Lungs: Clear and unlabored  Heart: regular rate and rhythm / no mumurs  Abdomen: soft, non-tender, non-distended, active BS             Fundus: firm, non-tender, U-1             Dressing OFF             Incision:  approximated with subcuticular and steri-strips / no erythema / no ecchymosis /  nodrainage  Perineum: no edema  Lochia: light   Extremities: trace edema, no calf pain or tenderness, negative Homans  A:        POD # 2 S/P cesarean section            Chronic hypertension - labetalol 300 TID and HCTZ 12.5 daily            Glucose intolerance - elevated FBS  P:        Routine postoperative care              Change to carb modified diet - start CBG / metformin 500 XR at HS                Marlinda Mike CNM, MSN 12/15/2011, 10:49 AM

## 2011-12-15 NOTE — Progress Notes (Signed)
Patient reports receiving TDaP vaccination in 2010.  Medication not administered.  Osvaldo Angst, RN----------

## 2011-12-16 LAB — GLUCOSE, CAPILLARY: Glucose-Capillary: 110 mg/dL — ABNORMAL HIGH (ref 70–99)

## 2011-12-16 MED ORDER — POLYSACCHARIDE IRON COMPLEX 150 MG PO CAPS: 150.0000 mg | ORAL_CAPSULE | Freq: Every day | ORAL | Status: DC

## 2011-12-16 MED ORDER — IBUPROFEN 100 MG/5ML PO SUSP: 800.0000 mg | Freq: Three times a day (TID) | ORAL | Status: DC | PRN

## 2011-12-16 MED ORDER — DSS 100 MG PO CAPS: 100.0000 mg | ORAL_CAPSULE | Freq: Every day | ORAL | Status: DC

## 2011-12-16 MED ORDER — HYDROCHLOROTHIAZIDE 12.5 MG PO CAPS: 25.0000 mg | ORAL_CAPSULE | Freq: Every day | ORAL | Status: DC

## 2011-12-16 MED ORDER — OXYCODONE-ACETAMINOPHEN 5-325 MG PO TABS: 1.0000 | ORAL_TABLET | ORAL | Status: DC | PRN

## 2011-12-16 NOTE — Plan of Care (Signed)
Problem: Discharge Progression Outcomes Goal: Complications resolved/controlled Outcome: Progressing Patient blood sugars are low 100's Did not want to take ordered Glucophage.Marlinda Mike aware.Hold med until AM

## 2011-12-16 NOTE — Discharge Summary (Signed)
POSTOPERATIVE DISCHARGE SUMMARY:  Patient ID: Kayla Gibbs MRN: [id number] DOB/AGE: 32-Jun-1982 32 y.o.  Admit date: 12/12/2011 Discharge date:  12/16/2011  Admission Diagnoses: 37 weeks fetal cardiac anomaly / chronic hypertension / glucose intolerance- GDMA1   Discharge Diagnoses:   POD 3 s/p Cesaren section / late preterm birth due to fetal cardiac anomaly / chronic hypertension / glucose intolerance- GDMA1 / ABL anemia  Prenatal history: G1P1001   EDC : 12/28/2011, by Last Menstrual Period  Prenatal care at [facility]  Primary provider : Marlinda Mike CNM Prenatal course complicated by chronic hypertension (preexisting uncontrolled) / glucose intolerance / fetal arrythmia / fetal cardiac anomaly  Prenatal Labs: ABO, Rh: O (02/26 0000) positive Antibody: Negative (02/26 0000) Rubella: Immune (02/26 0000)  RPR: NON REACTIVE (09/19 2200)  HBsAg: Negative (02/26 0000)  HIV: Non-reactive (02/26 0000)  GBS:   negative 1 hr Glucola : ABN - diet controlled GDM-A1   Medical / Surgical History :  Past medical history:  Past Medical History  Diagnosis Date  . Gestational diabetes   . Maternal chronic hypertension   . Fetal cardiac anomaly complicating pregnancy, antepartum   . Hypertension in pregnancy 12/13/2011  . Gestational diabetes 12/13/2011    Past surgical history:  Past Surgical History  Procedure Date  . Mouth surgery   . Cesarean section 12/13/2011    Procedure: CESAREAN SECTION;  Surgeon: Robley Fries, MD;  Location: [facility];  Service: Obstetrics;  Laterality: N/A;    Family History: History reviewed. No pertinent family history.  Social History:  reports that she has never smoked. She has never used smokeless tobacco. Her alcohol and drug histories not on file.   Allergies: Review of patient's allergies indicates no known allergies.    Current Medications at time of admission:  Prior to Admission medications   Medication Sig Start  Date End Date Taking? Authorizing Provider  labetalol (NORMODYNE) 300 MG tablet Take 300 mg by mouth 3 (three) times daily.   Yes Historical Provider, MD  OVER THE COUNTER MEDICATION Take 1 tablet by mouth daily. Vitafusion Prenatal Gummie   Yes Historical Provider, MD  ranitidine (ZANTAC) 150 MG tablet Take 150-300 mg by mouth 2 (two) times daily. Take 1 tablet in the morning and 2 tablets at night   Yes Historical Provider, MD     Procedures: Cesarean section delivery of female newborn by Dr Juliene Pina  See operative report for further details  Postoperative / postpartum course: uneventful - elevated FBS on regular diet - restarted carb modified diet (prescribed metformin - patient declined medication & will monitor FBS at home)  Physical Exam:   VSS: Blood pressure 152/94, pulse 102, temperature 98.1 F (36.7 C), temperature source Oral, resp. rate 18, height 5\' 5"  (1.651 m), weight 117.935 kg (260 lb), last menstrual period 03/23/2011, SpO2 98.00%, unknown if currently breastfeeding.  LABS:   preop hgb 10.1  / postop hgb 8.6 General: obese / NAD / pleasant without pain Heart:RRR Lungs:clear Abdomen: soft / active BS / panus soft without edema or errythema Extremities:trace edema / negative any signs DVT  Incision:  approximated with subcuticular and steri-stips / no erythema / no ecchymosis / no drainage  Discharge Instructions:  Discharged Condition: stable Activity: pelvic rest and postoperative restrictions x 2 weeks Diet: routine Medications: see below   Medication List     As of 12/16/2011  9:36 AM    TAKE these medications         DSS  100 MG Caps   Take 100 mg by mouth daily.      hydrochlorothiazide 12.5 MG capsule   Commonly known as: MICROZIDE   Take 2 capsules (25 mg total) by mouth daily.      ibuprofen 100 MG/5ML suspension   Commonly known as: ADVIL,MOTRIN   Take 40 mLs (800 mg total) by mouth every 8 (eight) hours as needed for pain.      iron  polysaccharides 150 MG capsule   Commonly known as: NIFEREX   Take 1 capsule (150 mg total) by mouth daily.      labetalol 300 MG tablet   Commonly known as: NORMODYNE   Take 300 mg by mouth 3 (three) times daily.      OVER THE COUNTER MEDICATION   Take 1 tablet by mouth daily. Vitafusion Prenatal Gummie      oxyCODONE-acetaminophen 5-325 MG per tablet   Commonly known as: PERCOCET/ROXICET   Take 1 tablet by mouth every 4 (four) hours as needed (moderate - severe pain).      ranitidine 150 MG tablet   Commonly known as: ZANTAC   Take 150-300 mg by mouth 2 (two) times daily. Take 1 tablet in the morning and 2 tablets at night       Condition: stable Postpartum Instructions: [facility] Booklet Discharge to: home Disposition: Final discharge disposition not confirmed Follow up :      Follow-up Information    Follow up with Marlinda Mike, CNM. Schedule an appointment as soon as possible for a visit in 1 week. (with Amantha Sklar for BP and incision check)    Contact information:   [address] [place] [address] [phone]           Signed: Marlinda Mike CNM, MSN 12/16/2011, 9:36 AM

## 2011-12-16 NOTE — Progress Notes (Signed)
POSTOPERATIVE DAY # 3 S/P cesarean section   S:         Reports feeling well - sticking with diet since yesterday / declined metformin             Tolerating po intake / no nausea / no vomiting / + flatus / +  BM             Bleeding is light             Pain controlled withibuprofen (OTC)             Up ad lib / ambulatory  Newborn breast feeding     O:  A & O x 3              VS: Blood pressure 152/94, pulse 102, temperature 98.1 F (36.7 C), temperature source Oral, resp. rate 18, height 5\' 5"  (1.651 m), weight 117.935 kg (260 lb), last menstrual period 03/23/2011, SpO2 98.00%, unknown if currently breastfeeding.  Lungs: Clear and unlabored  Heart: regular rate and rhythm / no mumurs  Abdomen: soft, non-tender, non-distended, active BS             Fundus: firm, non-tender, U-1             Dressing OFF              Incision:  approximated with subcuticular with steri-strips / no erythema / no ecchymosis / no drainage  Perineum: no edema  Lochia: spotting  Extremities: trace edema, no calf pain or tenderness, negative Homans  A:        POD # 3 S/P cesarean section            Chronic hypertension            Glucose intolerance  P:        Routine postoperative care              Discharge home - may room in in NICU with newborn discharge pending in next 24 hours             OV in 1 week - BP check and reviewed FBS log    Marlinda Mike CNM, MSN 12/16/2011, 9:27 AM

## 2011-12-16 NOTE — Progress Notes (Signed)
Pt ambulated out  Teaching complete 

## 2011-12-19 NOTE — Discharge Summary (Signed)
Reviewed and agree with note V.Acheron Sugg, MD 

## 2012-01-28 ENCOUNTER — Ambulatory Visit (HOSPITAL_COMMUNITY)
Admission: RE | Admit: 2012-01-28 | Discharge: 2012-01-28 | Disposition: A | Discharge status: Home / Self Care | Payer: BC Managed Care – PPO | Source: Ambulatory Visit | Attending: Obstetrics & Gynecology | Admitting: Obstetrics & Gynecology

## 2012-01-28 NOTE — Progress Notes (Signed)
Infant Lactation Consultation Outpatient Visit Note  Patient Name: Kayla Gibbs           Baby [name] Date of Birth: 09/03/1980                         D.O.B 12/13/2012 Birth Weight:                                            8 lbs - 2 oz Gestational Age at Delivery: Gestational Age: <None> term Type of Delivery: C=Section  Breastfeeding History Frequency of Breastfeeding:  7-8 times a day, every 2-4 hours Length of Feeding: 20 minutes Voids: with every feed Stools:  With every feed  Supplementing / Method: Pumping:  Type of Pump: DEP   Frequency:once or twice a day,   Volume:    Comments:  Mom is mostly breast feeding, with occasional bottles. Mom is pumping to freeze and save milk.    Consultation Evaluation:  Mom in for lactation consult after her term baby was in the NICU immediately after birth, for r/o cardiac defect - baby now doing fine, as per cardiologist. [name] is now 59 weeks old, and weighs 9 lbs - 12.6 oz today.This is a little less than an ounce a day, but the baby appears healthy and well. I observed mom's latch, and she used cradle hold, and had baby latched shallow. This was causing mom pain with feeding. I had mom use the cross cradle hold to obtain a much deep[er latch. Mom no longer felt any discomfort with the feeding, and the baby was transferring a much larger amount of milk. This was evidenced by the loud swallows at the beginning of the feed, which mom reports she did not hear with the shallow latch.  [name] took a total of 80 mls in about 30 minutes, and was satisfied. I feel [name] will gain weight faster now that she is getting a better flow of milk. Mom will also be much more comfortable with feeds.   Initial Feeding Assessment: Pre-feed Weight:4560 Post-feed ZOXWRU:0454 Amount Transferred:80 mls Comments: baby had gulping swallows at beginning of feed. I had mom recline to decrease mom's flow, which helped the baby control the  The feed better. She  was overwhelmed by the large flow - pulling her ahead away and fussy, but then calmed and suckled and swallowed comfortable.  Additional Feeding Assessment: Pre-feed Weight: Post-feed Weight: Amount Transferred: Comments:  Additional Feeding Assessment: Pre-feed Weight: Post-feed Weight: Amount Transferred: Comments:  Total Breast milk Transferred this Visit:  Total Supplement Given:   Additional Interventions:   Follow-Up Mom will call lactation as needed      [name] 01/28/2012, 11:15 AM

## 2012-01-31 ENCOUNTER — Ambulatory Visit (HOSPITAL_COMMUNITY)
Admission: RE | Admit: 2012-01-31 | Discharge: 2012-01-31 | Disposition: A | Discharge status: Home / Self Care | Payer: BC Managed Care – PPO | Source: Ambulatory Visit | Attending: Obstetrics & Gynecology | Admitting: Obstetrics & Gynecology

## 2012-01-31 NOTE — Progress Notes (Addendum)
Infant Lactation Consultation Outpatient Visit Note  Patient Name: Unnamed Hino Date of Birth:                                                   [birth year] Birth Weight:                                                    8-2                 today's weight    9-13.3 Gestational Age at Delivery: Gestational Age: <None> Type of Delivery:   Breastfeeding History Frequency of Breastfeeding: every 2 hours  Length of Feeding: 15-20 minutes  Voids: every 2-4 hours Stools: every 2-4 hours  Supplementing / Method: Pumping:  Type of Pump:  PIS DEP   Frequency:once a day   Volume:  3-4 ounces    Fed to baby daily  Comments:  Mom used the nipple shield today because baby was pinching her     Consultation Evaluation:  Mom returned for lactation consult   . She is having trouble getting baby to stay latched - she pulls off. Mom has a strong  ejection reflex, causing her milk to flow quickly. Mom has  not been reclining as much as when she was here last time. Arna Medici did better staying latched with mom reclined, to decrease her milk flow. This allows [name] to control her  suck, swallow and breathing better.Arna Medici has been eating every 2 hours today. I had mom pump with a manual pump after feeding [name], and  she was not able to express any additional milk. I am afraid that mom's milk supply is low. I gave her information on herbs to increase her supply, and suggested she pump every 4 hours - 6 times a day, and offer whatever she expresses to [place] as a supplement. Mom does not want to supplement with formula, so I suggested she see if her friend who donated her beast milk to Arna Medici, while she was in the NICU, would be able to donate some again. This way, after beast feeding, mom could offer [name] a supplement with breast milk, if mom is not able to provide it herself.  I also  told mom to call her pediatricain and discuss the slow weight gain with him.  I did a creamatocrit on  mom's hind milk today, and it was good, at 28.3 Cals/ounce.Maryruth Hancock gained only 0.7 ounces in the last 2 days  Initial Feeding Assessment: Pre-feed [id number] Post-feed [id number]:7253 Amount Transferred:80 Comments:  Additional Feeding Assessment: Pre-feed Weight: Post-feed Weight: Amount Transferred: Comments:  Additional Feeding Assessment: Pre-feed Weight: Post-feed Weight: Amount Transferred: Comments:  Total Breast milk Transferred this Visit:  Total Supplement Given:   Additional Interventions:   Follow-Up  Mom will call her pediatrician, and will come in for a follow up lactation consult, as needed      [name] 01/31/2012, 4:31 PM

## 2012-02-02 ENCOUNTER — Ambulatory Visit (HOSPITAL_COMMUNITY)
Admission: RE | Admit: 2012-02-02 | Discharge: 2012-02-02 | Disposition: A | Discharge status: Home / Self Care | Payer: BC Managed Care – PPO | Source: Ambulatory Visit | Attending: Obstetrics & Gynecology | Admitting: Obstetrics & Gynecology

## 2012-02-03 ENCOUNTER — Inpatient Hospital Stay (HOSPITAL_COMMUNITY): Admission: RE | Admit: 2012-02-03 | Payer: BC Managed Care – PPO | Source: Ambulatory Visit

## 2012-02-03 NOTE — Progress Notes (Signed)
Infant Lactation Consultation Outpatient Visit Note  Patient Name: Kayla Gibbs                           Baby Kayla Gibbs Date of Birth: 1980-08-28                                          9/20.2013 Birth Weight:                                                            Weight on 11/8      9 lbs - 13.3  oz Gestational Age at Delivery: Gestational Age: <None> Type of Delivery:   Breastfeeding History Frequency of Breastfeeding:  Length of Feeding:  Voids:  Stools:   Supplementing / Method: Pumping:  Type of Pump:   Frequency:  Volume:    Comments:    Consultation Evaluation:  Mom in to weigh baby. She has been breast feeding on demand, every 45-60 minutes, for the last 2 days. She fed the baby formula once - 60 mls. She has been adding in pumping when she can, and has a bottle of 3 ounces at home of EBM.   The baby did gain in the last 2 days - she weighs 10 lbs - 0.9 ounces today   - a weight gain of  3 .6 ounces in 48 hours  Initial Feeding Assessment: Pre-feed Weight: Post-feed Weight: Amount Transferred: Comments:  Additional Feeding Assessment: Pre-feed Weight: Post-feed Weight: Amount Transferred: Comments:  Additional Feeding Assessment: Pre-feed Weight: Post-feed Weight: Amount Transferred: Comments:  Total Breast milk Transferred this Visit:  Total Supplement Given:   Additional Interventions:   Follow-Up  Mom will come to Breast feeding support group on Tuesday, 11/12, and check baby's weight again      [name] 02/03/2012, 12:10 PM

## 2012-11-12 ENCOUNTER — Ambulatory Visit (HOSPITAL_COMMUNITY): Payer: BC Managed Care – PPO

## 2012-11-12 ENCOUNTER — Encounter (HOSPITAL_COMMUNITY): Payer: BC Managed Care – PPO

## 2012-11-17 ENCOUNTER — Encounter (HOSPITAL_COMMUNITY): Payer: BC Managed Care – PPO

## 2013-11-22 IMAGING — US US OB COMP +14 WK
1 series · 12 of 28 positions shown · non-contrast
Comparison: none

[Series 1: us ob comp +14 wk · 0.32mm/px · 12 of 77 slices shown]
[im 3/77]
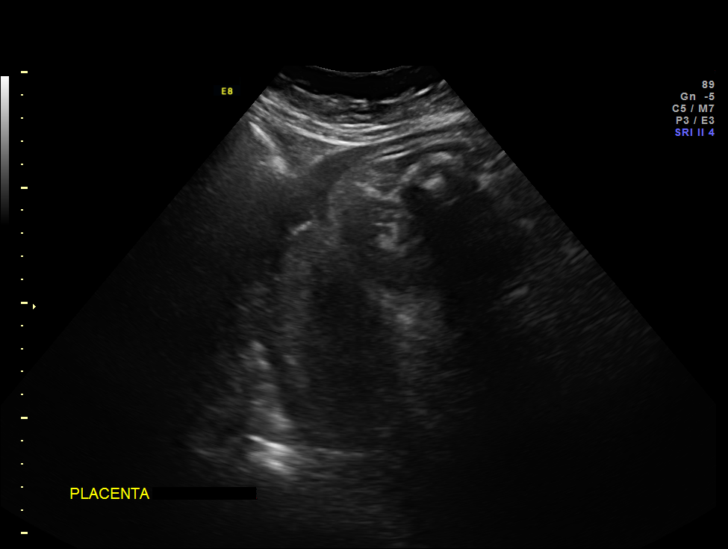
[im 9/77]
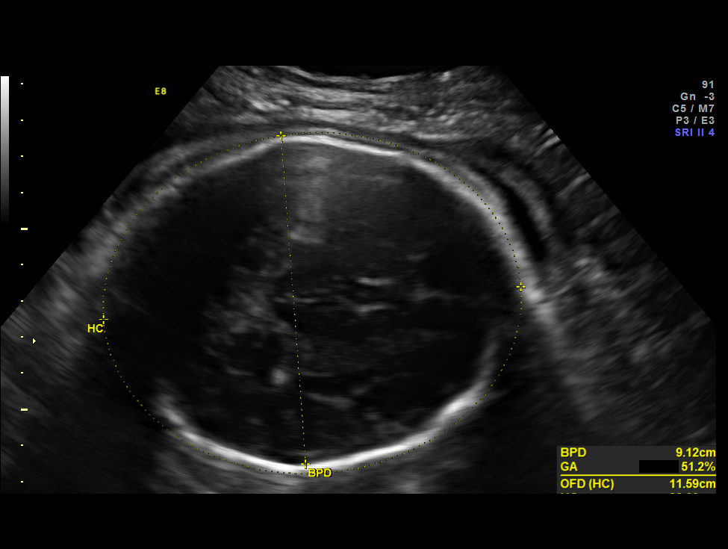
[im 15/77]
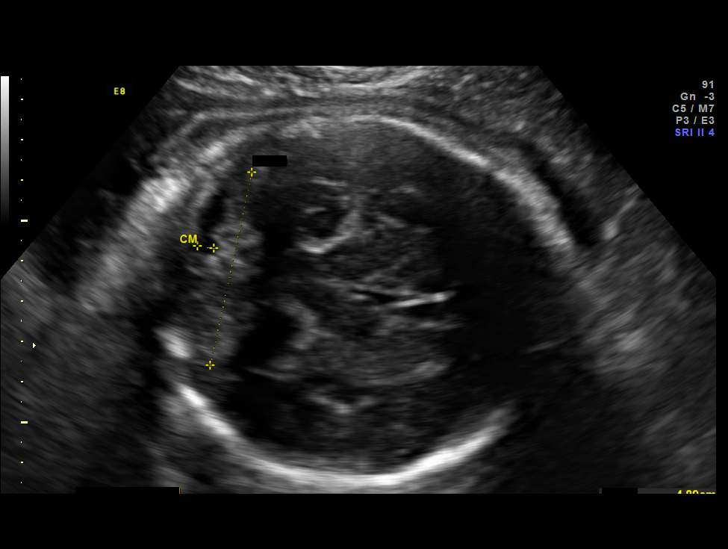
[im 23/77]
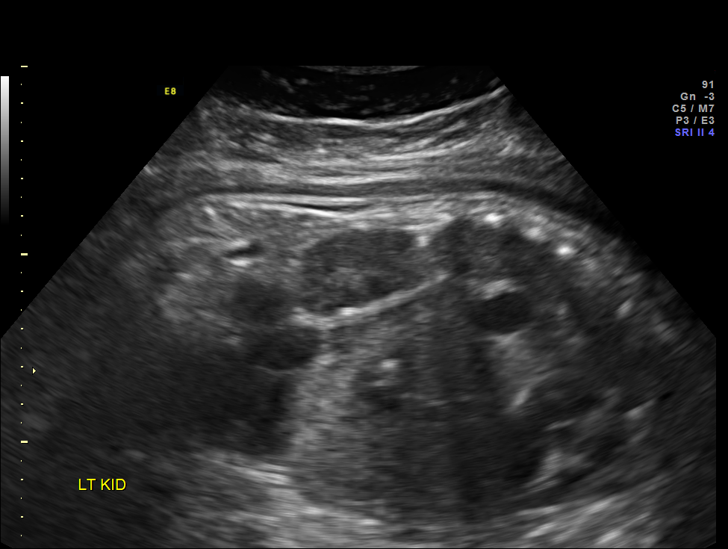
[im 29/77]
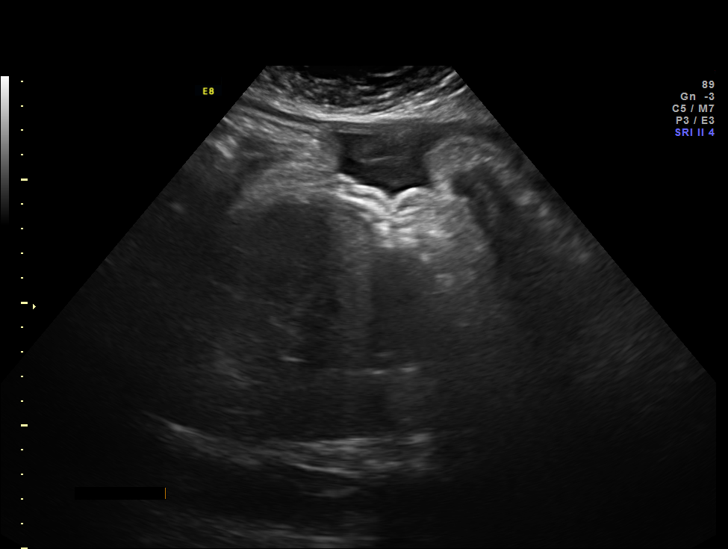
[im 34/77]
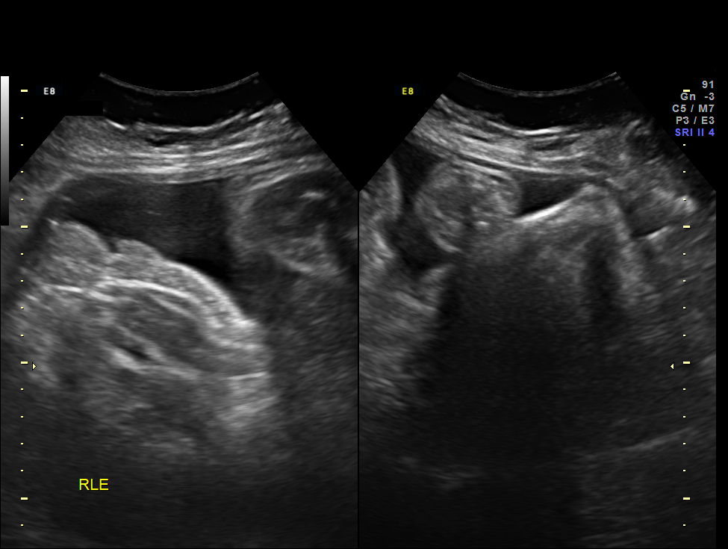
[im 43/77]
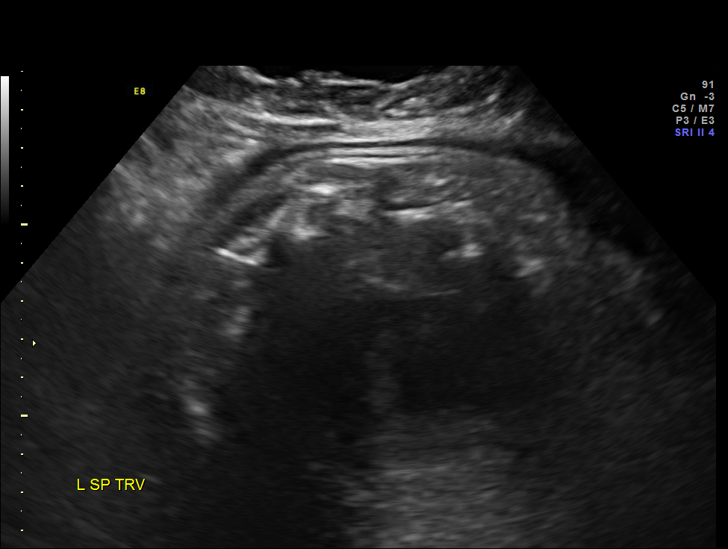
[im 48/77]
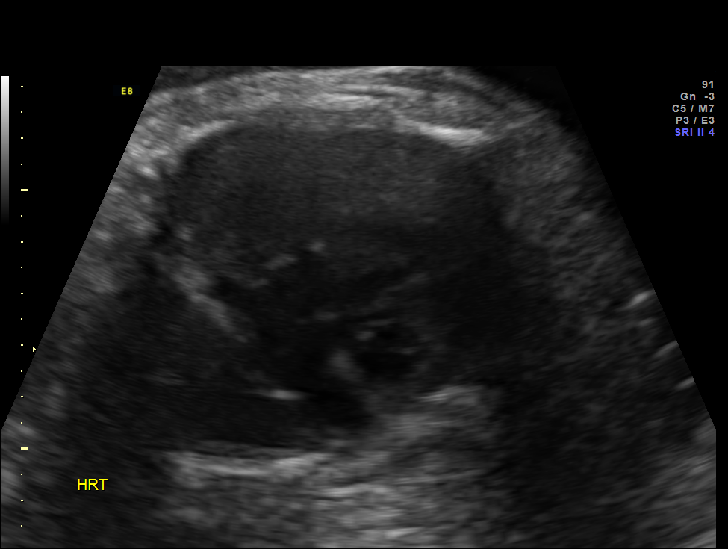
[im 54/77]
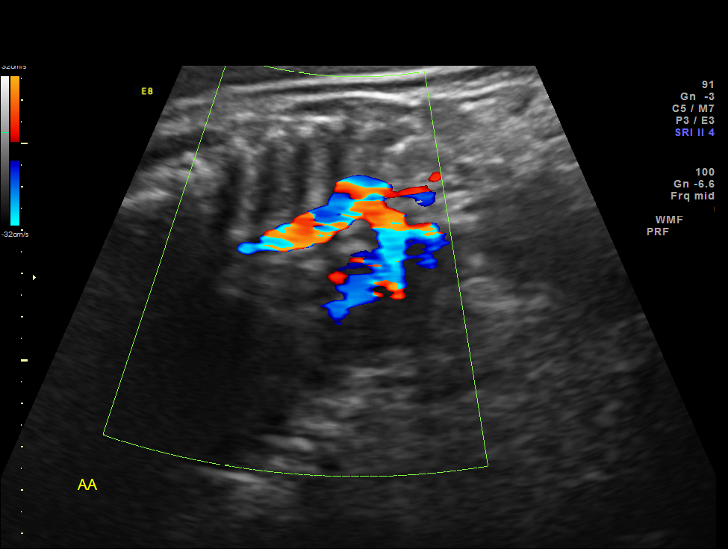
[im 62/77]
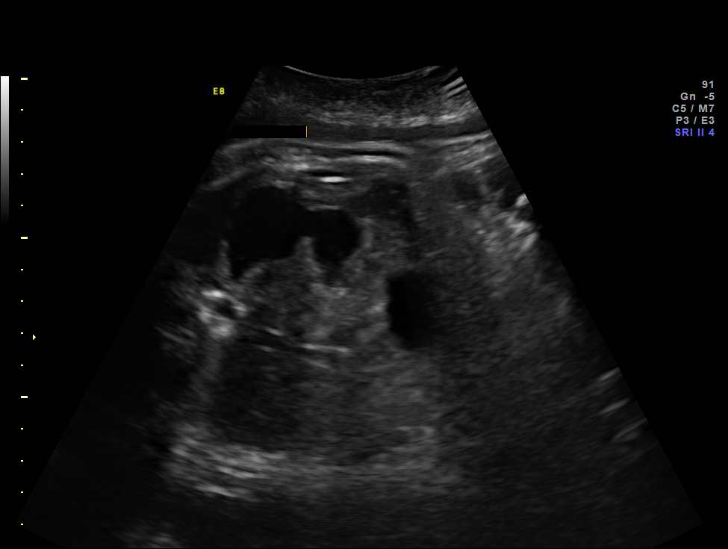
[im 68/77]
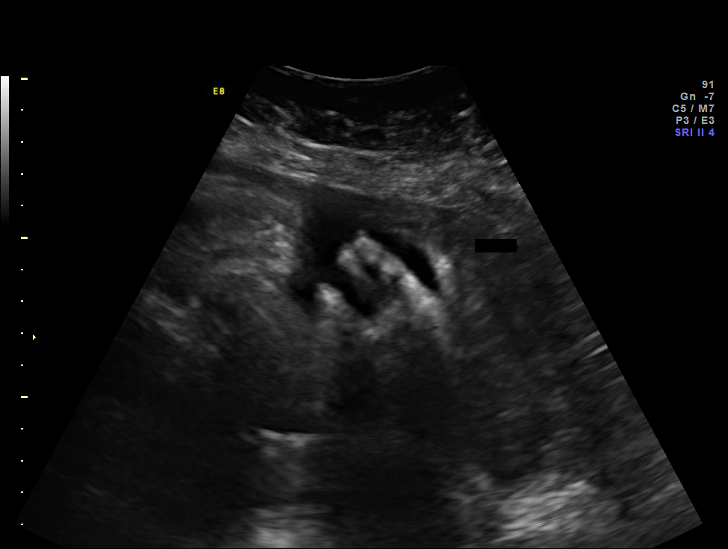
[im 74/77]
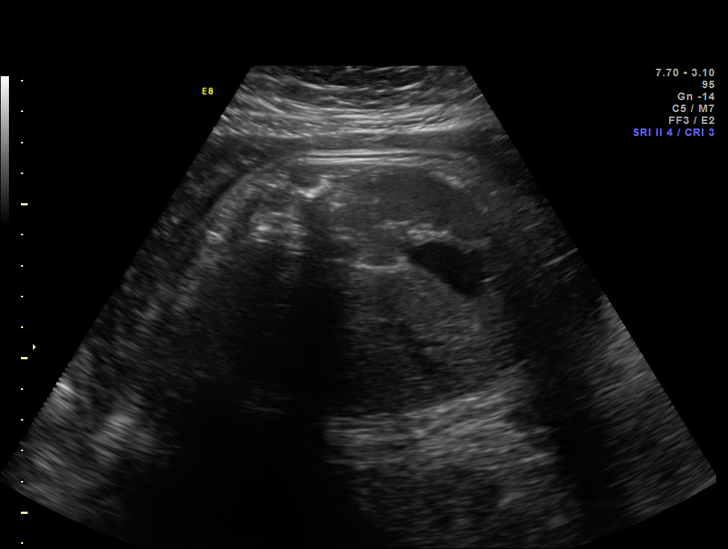

[12 of 28 positions shown; findings below may reference images not displayed]

OBSTETRICS REPORT
                      (Signed Final 12/12/2011 [DATE])

Service(s) Provided

 US OB COMP + 14 WK                                    76805.1
Indications

 Fetal edema
 Cardiac (heart) anomaly, unspecified
 Hypertension - Chronic/Pre-existing
 Diabetes - Gestational
 Basic anatomic survey
Fetal Evaluation

 Num Of Fetuses:    1
 Fetal Heart Rate:  139                          bpm
 Cardiac Activity:  Observed
 Presentation:      Cephalic
 Placenta:          Posterior
 P. Cord            Visualized
 Insertion:

 Amniotic Fluid
 AFI FV:      Subjectively within normal limits
                                             Larg Pckt:     5.1  cm
Biometry

 BPD:     91.5  mm     G. Age:  37w 1d                CI:         78.5   70 - 86
 OFD:    116.6  mm                                    FL/HC:      22.4   20.9 -

 HC:     331.7  mm     G. Age:  37w 6d       31  %    HC/AC:      0.98   0.92 -

 AC:     338.9  mm     G. Age:  37w 6d       69  %    FL/BPD:     81.2   71 - 87
 FL:      74.3  mm     G. Age:  38w 0d       59  %    FL/AC:      21.9   20 - 24
 HUM:     61.7  mm     G. Age:  35w 5d       39  %
 CER:     48.9  mm     G. Age:  N/A        > 95  %

 Est. FW:    3333  gm      7 lb 4 oz     76  %
Gestational Age

 LMP:           37w 5d        Date:  03/23/11                 EDD:   12/28/11
 U/S Today:     37w 5d                                        EDD:   12/28/11
 Best:          37w 5d     Det. By:  LMP  (03/23/11)          EDD:   12/28/11
Anatomy

 Cranium:          Appears normal         Aortic Arch:      Not well visualized
 Fetal Cavum:      Appears normal         Ductal Arch:      Not well visualized
 Ventricles:       Appears normal         Diaphragm:        Appears normal
 Choroid Plexus:   Appears normal         Stomach:          Appears normal
 Cerebellum:       Appears normal         Abdomen:          Appears normal
 Posterior Fossa:  Appears normal         Abdominal Wall:   Not well visualized
 Nuchal Fold:      Not applicable (>20    Cord Vessels:     Appears normal (3
                   wks GA)                                  vessel cord)
 Face:             Orbits appear          Kidneys:          Appear normal
                   normal
 Lips:             Appears normal         Bladder:          Appears normal
 Heart:            Not well visualized    Spine:            Appears normal
 RVOT:             Not well visualized    Lower             See comments
                                          Extremities:
 LVOT:             Not well visualized    Upper             See comments
                                          Extremities:

 Other:  Technically difficult due to advanced GA and fetal position.
Targeted Anatomy

 Fetal Central Nervous System
 Cisterna Magna:
Cervix Uterus Adnexa

 Cervix:       Not visualized (advanced GA >29 wks)
Impression

 IUP at 37+5 weeks (imaging was difficult due to advanced
 gestational age and fetal position)
 Generalized skin edema; extremities and face affected more
 than chest and abdomen; no ascites, pleural effusion or
 pericardial effusion
 Fetal ECHO on [DATE]: increased doppler flow velocity at
 insertion of the ductus arteriosus into the descending aorta -
 possible coarctation of aorta or premature ductal closure
 All other detailed fetal anatomy was seen and appeared
 normal; heart views, face views and CI not optimally
 visualized; with brief glimpses of the 4-chamber heart view,
 could not appreciate an enlarged right ventricle or a decrease
 in contractility
 Normal amniotic fluid volume
 Measurements consistent with LMP dating; EFW at the 76th
 %tile

 The US findings were shared with Ms. Daniel Jose and her
 husband. Initially, the appearance of the skin was most c/w
 increased adipose tissue. However, upon further inspection,
 especially of the extremities and face, the skin appeared to
 be abnormally thickened secondary to edema. The fetal
 ECHO report was reviewed. According to Dr. Qazal, if the
 fetus has premature ductal closure, then there would be a
 risk for right heart failure in utero. The heart and skin
 abnormalities are most likely linked; however, another
 unknown process could be causing the edema. Findings
 discussed with Dr. Lianet.

Recommendations

 Would begin working towards delivery at this time
 Suggest neonatal attending be present at delivery
 Notify pediatric cardiology of delivery

## 2014-01-24 ENCOUNTER — Encounter (HOSPITAL_COMMUNITY): Payer: Self-pay | Admitting: Obstetrics & Gynecology

## 2016-06-08 ENCOUNTER — Encounter (HOSPITAL_COMMUNITY): Payer: Self-pay | Admitting: Emergency Medicine

## 2016-06-08 ENCOUNTER — Emergency Department (HOSPITAL_COMMUNITY)
Admission: EM | Admit: 2016-06-08 | Discharge: 2016-06-09 | Disposition: A | Discharge status: Home / Self Care | Payer: Self-pay | Attending: Emergency Medicine | Admitting: Emergency Medicine

## 2016-06-08 DIAGNOSIS — Z79899 Other long term (current) drug therapy: Secondary | ICD-10-CM | POA: Insufficient documentation

## 2016-06-08 DIAGNOSIS — I10 Essential (primary) hypertension: Secondary | ICD-10-CM | POA: Insufficient documentation

## 2016-06-08 LAB — URINALYSIS, ROUTINE W REFLEX MICROSCOPIC
Bilirubin Urine: NEGATIVE
Glucose, UA: NEGATIVE mg/dL
Hgb urine dipstick: NEGATIVE
Ketones, ur: NEGATIVE mg/dL
NITRITE: NEGATIVE
PH: 7 (ref 5.0–8.0)
Protein, ur: NEGATIVE mg/dL
SPECIFIC GRAVITY, URINE: 1.009 (ref 1.005–1.030)

## 2016-06-08 LAB — POC URINE PREG, ED: Preg Test, Ur: NEGATIVE

## 2016-06-08 NOTE — ED Triage Notes (Signed)
States checked blood pressure yesterday and was 180's over 110's and had a headache clear speech now moves all extremities.

## 2016-06-08 NOTE — ED Provider Notes (Signed)
[facility]-EMERGENCY DEPT Provider Note   CSN: [id number] Arrival date & time: 06/08/16  2233  By signing my name below, I, Kayla Gibbs, attest that this documentation has been prepared under the direction and in the presence of Dione Boozeavid Altheia Shafran, MD. Electronically Signed: Diona BrownerJennifer Gibbs, ED Scribe. 06/08/16. 11:31 PM.   History   Chief Complaint Chief Complaint  Patient presents with  . Hypertension    HPI Kayla Gibbs is a 36 y.o. female with a PMHx of HTN when pregnant and Gestational DM who presents to the Emergency Department complaining of elevated blood pressure since yesterday. Pt reports she had an aching HA in the front of her head, and after laying down she decided to take her BP. She said it was in the "170's/110's". Pt reports her HA has improved, however, when she took her BP today it was 180/125. Pt states she doesn't feel dizzy but she does feel "tingly". Associated sx include chest tightness and nausea. The last time she took her blood pressure was a few years ago. Pt is adopted and is unaware of her family history. She is currently not seeing a PCP. Her LMP ended two days ago. Pt doesn't think she is pregnant, but is not sure because her and her husband, "pull out and pray". Pt does not smoke or do drugs. Pt denies blurred vision, ringing in her ears and any other associated sx.  The history is provided by the patient. No language interpreter was used.    Past Medical History:  Diagnosis Date  . Fetal cardiac anomaly complicating pregnancy, antepartum   . Gestational diabetes   . Gestational diabetes 12/13/2011  . Hypertension in pregnancy 12/13/2011  . Maternal chronic hypertension     There are no active problems to display for this patient.   Past Surgical History:  Procedure Laterality Date  . CESAREAN SECTION  12/13/2011   Procedure: CESAREAN SECTION;  Surgeon: Robley FriesVaishali R Mody, MD;  Location: [facility];  Service: Obstetrics;  Laterality: N/A;  . MOUTH SURGERY       OB History    Gravida Para Term Preterm AB Living   1 1 1  0 0 1   SAB TAB Ectopic Multiple Live Births   0 0 0 0 1       Home Medications    Prior to Admission medications   Medication Sig Start Date End Date Taking? Authorizing Provider  docusate sodium 100 MG CAPS Take 100 mg by mouth daily. 12/16/11   Marlinda Mikeanya Bailey, CNM  hydrochlorothiazide (MICROZIDE) 12.5 MG capsule Take 2 capsules (25 mg total) by mouth daily. 12/16/11   Marlinda Mikeanya Bailey, CNM  ibuprofen (ADVIL,MOTRIN) 100 MG/5ML suspension Take 40 mLs (800 mg total) by mouth every 8 (eight) hours as needed for pain. 12/16/11   Marlinda Mikeanya Bailey, CNM  iron polysaccharides (NIFEREX) 150 MG capsule Take 1 capsule (150 mg total) by mouth daily. 12/16/11   Marlinda Mikeanya Bailey, CNM  labetalol (NORMODYNE) 300 MG tablet Take 300 mg by mouth 3 (three) times daily.    Historical Provider, MD  OVER THE COUNTER MEDICATION Take 1 tablet by mouth daily. Vitafusion Prenatal Gummie    Historical Provider, MD  oxyCODONE-acetaminophen (PERCOCET/ROXICET) 5-325 MG per tablet Take 1 tablet by mouth every 4 (four) hours as needed (moderate - severe pain). 12/16/11   Marlinda Mikeanya Bailey, CNM  ranitidine (ZANTAC) 150 MG tablet Take 150-300 mg by mouth 2 (two) times daily. Take 1 tablet in the morning and 2 tablets at night  Historical Provider, MD    Family History History reviewed. No pertinent family history.  Social History Social History  Substance Use Topics  . Smoking status: Never Smoker  . Smokeless tobacco: Never Used  . Alcohol use No     Allergies   Patient has no known allergies.   Review of Systems Review of Systems  Eyes: Negative for visual disturbance.  Respiratory: Positive for chest tightness.   Gastrointestinal: Positive for nausea.  Neurological: Positive for headaches. Negative for dizziness.  All other systems reviewed and are negative.    Physical Exam Updated Vital Signs BP (!) 164/130 (BP Location: Left Arm)   Pulse 89   Temp  97.8 F (36.6 C) (Oral)   Resp 18   SpO2 100%   Physical Exam  Constitutional: She is oriented to person, place, and time. She appears well-developed and well-nourished.  HENT:  Head: Normocephalic and atraumatic.  Eyes: EOM are normal. Pupils are equal, round, and reactive to light.  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 No hemorrhage, exudate or papilledema.   Neck: Normal range of motion. Neck supple. No JVD present.  Cardiovascular: Normal rate, regular rhythm and normal heart sounds.   No murmur heard. Pulmonary/Chest: Effort normal and breath sounds normal. She has no wheezes. She has no rales. She exhibits no tenderness.  Abdominal: Soft. Bowel sounds are normal. She exhibits no distension and no mass. There is no tenderness.  Musculoskeletal: Normal range of motion. She exhibits no edema.  Trace pedal edema.   Lymphadenopathy:    She has no cervical adenopathy.  Neurological: She is alert and oriented to person, place, and time. No cranial nerve deficit. She exhibits normal muscle tone. Coordination normal.  Skin: Skin is warm and dry. No rash noted.  Psychiatric: She has a normal mood and affect. Her behavior is normal. Judgment and thought content normal.  Nursing note and vitals reviewed.    ED Treatments / Results  DIAGNOSTIC STUDIES: Oxygen Saturation is 100% on RA, normal by my interpretation.   COORDINATION OF CARE: 11:15 PM-Discussed next steps with pt include pregnancy test to make sure she is not pregnant before medication is prescribed for her. Pt verbalized understanding and is agreeable with the plan.    Labs (all labs ordered are listed, but only abnormal results are displayed) Labs Reviewed  BASIC METABOLIC PANEL - Abnormal; Notable for the following:       Result Value   Glucose, Bld 111 (*)    All other components within normal limits  URINALYSIS, ROUTINE W REFLEX MICROSCOPIC - Abnormal; Notable for the following:    Leukocytes, UA SMALL (*)     Bacteria, UA RARE (*)    Squamous Epithelial / LPF 0-5 (*)    All other components within normal limits  CBC WITH DIFFERENTIAL/PLATELET  POC URINE PREG, ED     Procedures Procedures (including critical care time)  Medications Ordered in ED Medications - No data to display   Initial Impression / Assessment and Plan / ED Course  I have reviewed the triage vital signs and the nursing notes.  Pertinent lab results that were available during my care of the patient were reviewed by me and considered in my medical decision making (see chart for details).  Headache which has resolved. Hypertension. Old records are reviewed, and she was treated for pregnancy-induced hypertension in 2013. She has not had her blood pressure checked since then. We'll check screening labs of. Ocular changes to suggest she has  had hypertension for some time. Anticipate starting her on antihypertensive medication.  Urinalysis shows no evidence of proteinuria, and renal function is normal. Blood pressure has come down, but is still moderately elevated. She is discharged with prescription for lisinopril-hydrochlorothiazide. She is given outpatient resources to try to find a PCP.  Final Clinical Impressions(s) / ED Diagnoses   Final diagnoses:  Essential hypertension    New Prescriptions New Prescriptions   LISINOPRIL-HYDROCHLOROTHIAZIDE (PRINZIDE,ZESTORETIC) 10-12.5 MG TABLET    Take 1 tablet by mouth daily.   I personally performed the services described in this documentation, which was scribed in my presence. The recorded information has been reviewed and is accurate.      Dione Booze, MD 06/09/16 [phone]

## 2016-06-09 LAB — CBC WITH DIFFERENTIAL/PLATELET
BASOS PCT: 0 %
Basophils Absolute: 0 10*3/uL (ref 0.0–0.1)
EOS ABS: 0 10*3/uL (ref 0.0–0.7)
Eosinophils Relative: 1 %
HCT: 41.3 % (ref 36.0–46.0)
Hemoglobin: 13.9 g/dL (ref 12.0–15.0)
Lymphocytes Relative: 18 %
Lymphs Abs: 1.5 10*3/uL (ref 0.7–4.0)
MCH: 29.9 pg (ref 26.0–34.0)
MCHC: 33.7 g/dL (ref 30.0–36.0)
MCV: 88.8 fL (ref 78.0–100.0)
MONO ABS: 0.3 10*3/uL (ref 0.1–1.0)
MONOS PCT: 3 %
Neutro Abs: 6.4 10*3/uL (ref 1.7–7.7)
Neutrophils Relative %: 78 %
PLATELETS: 303 10*3/uL (ref 150–400)
RBC: 4.65 MIL/uL (ref 3.87–5.11)
RDW: 12.6 % (ref 11.5–15.5)
WBC: 8.2 10*3/uL (ref 4.0–10.5)

## 2016-06-09 LAB — BASIC METABOLIC PANEL
ANION GAP: 11 (ref 5–15)
BUN: 15 mg/dL (ref 6–20)
CALCIUM: 9.4 mg/dL (ref 8.9–10.3)
CO2: 22 mmol/L (ref 22–32)
Chloride: 102 mmol/L (ref 101–111)
Creatinine, Ser: 0.84 mg/dL (ref 0.44–1.00)
Glucose, Bld: 111 mg/dL — ABNORMAL HIGH (ref 65–99)
Potassium: 4.1 mmol/L (ref 3.5–5.1)
SODIUM: 135 mmol/L (ref 135–145)

## 2016-06-09 MED ORDER — LISINOPRIL-HYDROCHLOROTHIAZIDE 10-12.5 MG PO TABS: 1.0000 | ORAL_TABLET | Freq: Every day | ORAL | 0 refills | Status: AC

## 2016-06-09 NOTE — Discharge Instructions (Signed)
Please monitor your blood pressure at home and keep a record of it. Take this record with you when you see your primary care provider.

## 2016-06-09 NOTE — ED Notes (Signed)
Dr.Glick notified of pt blood pressure. No further orders given at this time.

## 2016-09-19 ENCOUNTER — Encounter (HOSPITAL_COMMUNITY): Payer: Self-pay

## 2016-09-19 ENCOUNTER — Other Ambulatory Visit: Payer: Self-pay

## 2016-09-19 ENCOUNTER — Emergency Department (HOSPITAL_COMMUNITY): Payer: Self-pay

## 2016-09-19 ENCOUNTER — Emergency Department (HOSPITAL_COMMUNITY)
Admission: EM | Admit: 2016-09-19 | Discharge: 2016-09-19 | Disposition: A | Discharge status: Home / Self Care | Payer: Self-pay | Attending: Emergency Medicine | Admitting: Emergency Medicine

## 2016-09-19 DIAGNOSIS — Z79899 Other long term (current) drug therapy: Secondary | ICD-10-CM | POA: Insufficient documentation

## 2016-09-19 DIAGNOSIS — R0789 Other chest pain: Secondary | ICD-10-CM | POA: Insufficient documentation

## 2016-09-19 DIAGNOSIS — R079 Chest pain, unspecified: Secondary | ICD-10-CM

## 2016-09-19 DIAGNOSIS — I159 Secondary hypertension, unspecified: Secondary | ICD-10-CM

## 2016-09-19 DIAGNOSIS — F419 Anxiety disorder, unspecified: Secondary | ICD-10-CM

## 2016-09-19 LAB — I-STAT BETA HCG BLOOD, ED (MC, WL, AP ONLY): I-stat hCG, quantitative: <5 m[IU]/mL (ref <5)

## 2016-09-19 LAB — I-STAT TROPONIN, ED
TROPONIN I, POC: 0 ng/mL (ref 0.00–0.08)
Troponin i, poc: 0 ng/mL (ref 0.00–0.08)

## 2016-09-19 LAB — BASIC METABOLIC PANEL
ANION GAP: 11 (ref 5–15)
BUN: 14 mg/dL (ref 6–20)
CHLORIDE: 104 mmol/L (ref 101–111)
CO2: 21 mmol/L — ABNORMAL LOW (ref 22–32)
CREATININE: 0.78 mg/dL (ref 0.44–1.00)
Calcium: 9.4 mg/dL (ref 8.9–10.3)
GFR calc non Af Amer: >60 mL/min (ref >60)
Glucose, Bld: 150 mg/dL — ABNORMAL HIGH (ref 65–99)
POTASSIUM: 3.9 mmol/L (ref 3.5–5.1)
Sodium: 136 mmol/L (ref 135–145)

## 2016-09-19 LAB — CBC
HCT: 40.8 % (ref 36.0–46.0)
HEMOGLOBIN: 13.7 g/dL (ref 12.0–15.0)
MCH: 30.2 pg (ref 26.0–34.0)
MCHC: 33.6 g/dL (ref 30.0–36.0)
MCV: 90.1 fL (ref 78.0–100.0)
PLATELETS: 321 10*3/uL (ref 150–400)
RBC: 4.53 MIL/uL (ref 3.87–5.11)
RDW: 12.8 % (ref 11.5–15.5)
WBC: 9 10*3/uL (ref 4.0–10.5)

## 2016-09-19 LAB — D-DIMER, QUANTITATIVE (NOT AT ARMC)

## 2016-09-19 NOTE — ED Notes (Signed)
Pt states she needs to leave by 7:15, pt doesn't want to get into a gown and hooked up.

## 2016-09-19 NOTE — ED Provider Notes (Signed)
[facility]-EMERGENCY DEPT Provider Note   CSN: [id number] Arrival date & time: 09/19/16  0036     History   Chief Complaint Chief Complaint  Patient presents with  . Chest Pain  . Panic Attack    HPI Kayla Gibbs is a 36 y.o. female.  HPI Kayla Gibbs is a 36 y.o. female with hx of gestational Diabetes, anxiety, presents to emergency department complaining of chest pain. Patient states her pain started early this morning/late last night. She states the pain is the left side of the chest. Patient states that she checked her blood pressure and it was high, states it was 170 systolic, which worried her and she came to emergency department for evaluation. Patient denies associated shortness of breath. No dizziness or lightheadedness. No nausea or vomiting. No history of similar pain in the past. She states she has been moving and lifting heavy objects in the last several days. She reports being under a lot of stress and feels anxious at this time. She denies being pregnant. No other symptoms. She states nothing that she has tried making her pain better or worse. Denies recent travel. No recent surgeries. No swelling in extremities. She states she is adopted and is not sure of her family history.  Past Medical History:  Diagnosis Date  . Fetal cardiac anomaly complicating pregnancy, antepartum   . Gestational diabetes   . Gestational diabetes 12/13/2011  . Hypertension in pregnancy 12/13/2011  . Maternal chronic hypertension     There are no active problems to display for this patient.   Past Surgical History:  Procedure Laterality Date  . CESAREAN SECTION  12/13/2011   Procedure: CESAREAN SECTION;  Surgeon: Robley Fries, MD;  Location: [facility];  Service: Obstetrics;  Laterality: N/A;  . MOUTH SURGERY      OB History    Gravida Para Term Preterm AB Living   1 1 1  0 0 1   SAB TAB Ectopic Multiple Live Births   0 0 0 0 1       Home Medications    Prior to Admission  medications   Medication Sig Start Date End Date Taking? Authorizing Provider  lisinopril-hydrochlorothiazide (PRINZIDE,ZESTORETIC) 10-12.5 MG tablet Take 1 tablet by mouth daily. 06/09/16  Yes Dione Booze, MD    Family History No family history on file.  Social History Social History  Substance Use Topics  . Smoking status: Never Smoker  . Smokeless tobacco: Never Used  . Alcohol use No     Allergies   Patient has no known allergies.   Review of Systems Review of Systems  Constitutional: Negative for chills and fever.  Respiratory: Positive for chest tightness. Negative for cough and shortness of breath.   Cardiovascular: Positive for chest pain. Negative for palpitations and leg swelling.  Gastrointestinal: Negative for abdominal pain, diarrhea, nausea and vomiting.  Genitourinary: Negative for dysuria, flank pain and pelvic pain.  Musculoskeletal: Negative for arthralgias, myalgias, neck pain and neck stiffness.  Skin: Negative for rash.  Neurological: Negative for dizziness, weakness and headaches.  Psychiatric/Behavioral: The patient is nervous/anxious.   All other systems reviewed and are negative.    Physical Exam Updated Vital Signs BP (!) 145/108 (BP Location: Left Arm)   Pulse 78   Temp 98 F (36.7 C) (Oral)   Resp 14   Ht 5\' 5"  (1.651 m)   Wt 104.3 kg (230 lb)   SpO2 100%   BMI 38.27 kg/m   Physical Exam  Constitutional:  She is oriented to person, place, and time. She appears well-developed and well-nourished. No distress.  HENT:  Head: Normocephalic.  Eyes: Conjunctivae are normal.  Neck: Neck supple.  Cardiovascular: Normal rate, regular rhythm and normal heart sounds.   Pulmonary/Chest: Effort normal and breath sounds normal. No respiratory distress. She has no wheezes. She has no rales. She exhibits no tenderness.  Abdominal: Soft. Bowel sounds are normal. She exhibits no distension. There is no tenderness. There is no rebound.    Musculoskeletal: She exhibits no edema.  Neurological: She is alert and oriented to person, place, and time.  Skin: Skin is warm and dry.  Psychiatric: She has a normal mood and affect. Her behavior is normal.  Nursing note and vitals reviewed.    ED Treatments / Results  Labs (all labs ordered are listed, but only abnormal results are displayed) Labs Reviewed  BASIC METABOLIC PANEL - Abnormal; Notable for the following:       Result Value   CO2 21 (*)    Glucose, Bld 150 (*)    All other components within normal limits  CBC  D-DIMER, QUANTITATIVE (NOT [facility])  I-STAT TROPOININ, ED  I-STAT TROPOININ, ED  I-STAT BETA HCG BLOOD, ED ([facility], [facility], [facility] ONLY)    EKG  EKG Interpretation  Date/Time:  Thursday September 19 2016 00:52:11 EDT Ventricular Rate:  118 PR Interval:  140 QRS Duration: 82 QT Interval:  346 QTC Calculation: 484 R Axis:   76 Text Interpretation:  Sinus tachycardia ST & T wave abnormality, consider inferior ischemia Abnormal ECG No old tracing to compare Confirmed by Dione BoozeGlick, David ([id number]) on 09/19/2016 2:46:03 AM       Radiology No results found.  Procedures Procedures (including critical care time)  Medications Ordered in ED Medications - No data to display   Initial Impression / Assessment and Plan / ED Course  I have reviewed the triage vital signs and the nursing notes.  Pertinent labs & imaging results that were available during my care of the patient were reviewed by me and considered in my medical decision making (see chart for details).     Patient in emergency department with left-sided chest pain, she appears to be very anxious, she is slightly hypertensive here. Her pain is very atypical. She is also tearful and crying. Will check labs, troponin, chest x-ray.   Patient's labs are negative including EKG and troponin. Her heart rate went down from 120s to 80s when she is more calmer. Will get a d-dimer and repeat troponin for  reevaluation.   Delta troponin 6 hours after initial is 0. D-dimer negative. Patient is in no acute distress, she does have slightly increased pain with moving left arm, question musculoskeletal pain. Very atypical for ACS with minimal risk factors. She appears to be very stressed out. She discussed possibility of this being a panic attack or anxiety which I told her that it is a possibility, however I'm unable to diagnose her with that at this time. She will follow-up with her family doctor for recheck of blood pressure and further treatment. Stable for dc home at this time.   Vitals:   09/19/16 0055 09/19/16 0651 09/19/16 0715 09/19/16 0918  BP: (!) 163/116 (!) 169/116 (!) 173/116 (!) 145/108  Pulse: (!) 123 86 86 78  Resp: 18  14   Temp: 98 F (36.7 C)     TempSrc: Oral     SpO2: 97% 97% 100% 100%  Weight: 104.3 kg (230 lb)  Height: 5\' 5"  (1.651 m)        Final Clinical Impressions(s) / ED Diagnoses   Final diagnoses:  Chest pain, unspecified type  Anxiety  Secondary hypertension    New Prescriptions Discharge Medication List as of 09/19/2016  9:07 AM       Jaynie Crumble, PA-C 09/19/16 1027    Jerelyn Scott, MD 09/19/16 1032

## 2016-09-19 NOTE — Discharge Instructions (Signed)
Take naprosyn for pain and inflammation. Continue blood pressure medications. Follow up with family doctor in 1 week if not improving.

## 2016-09-19 NOTE — ED Notes (Signed)
Pt states that she does not want to get a chest xray due to the fact of no insurance

## 2016-09-19 NOTE — ED Notes (Signed)
Pt took home BP meds; blood being redrawn now.

## 2016-09-19 NOTE — ED Triage Notes (Signed)
Pt states that all week she has been having central CP along with high BP. States that she she has been moving and arms have been sore. States pain is a tightness that is L sided, pt is very anxious and tearful. Denies SOB, some nausea, no vomiting.
